# Patient Record
Sex: Female | Born: 1960
Health system: Southern US, Community
[De-identification: ages and names within clinical notes are randomized; demographics above are authoritative.]

## PROBLEM LIST (undated history)

## (undated) DIAGNOSIS — Z8489 Family history of other specified conditions: Secondary | ICD-10-CM

## (undated) DIAGNOSIS — R112 Nausea with vomiting, unspecified: Secondary | ICD-10-CM

## (undated) DIAGNOSIS — Z789 Other specified health status: Secondary | ICD-10-CM

## (undated) DIAGNOSIS — Z9889 Other specified postprocedural states: Secondary | ICD-10-CM

## (undated) HISTORY — PX: TUBAL LIGATION: SHX77

---

## 2004-01-30 ENCOUNTER — Other Ambulatory Visit: Admission: RE | Admit: 2004-01-30 | Discharge: 2004-01-30 | Payer: Self-pay | Admitting: Obstetrics and Gynecology

## 2005-09-16 ENCOUNTER — Other Ambulatory Visit: Admission: RE | Admit: 2005-09-16 | Discharge: 2005-09-16 | Payer: Self-pay | Admitting: Obstetrics and Gynecology

## 2005-11-24 HISTORY — PX: BREAST ENHANCEMENT SURGERY: SHX7

## 2011-03-14 ENCOUNTER — Inpatient Hospital Stay (INDEPENDENT_AMBULATORY_CARE_PROVIDER_SITE_OTHER)
Admission: RE | Admit: 2011-03-14 | Discharge: 2011-03-14 | Disposition: A | Discharge status: Home / Self Care | Payer: 59 | Source: Ambulatory Visit | Attending: Family Medicine | Admitting: Family Medicine

## 2011-03-14 DIAGNOSIS — J309 Allergic rhinitis, unspecified: Secondary | ICD-10-CM

## 2011-03-14 DIAGNOSIS — L259 Unspecified contact dermatitis, unspecified cause: Secondary | ICD-10-CM

## 2012-11-20 ENCOUNTER — Emergency Department
Admission: EM | Admit: 2012-11-20 | Discharge: 2012-11-20 | Disposition: A | Discharge status: Home / Self Care | Payer: 59 | Source: Home / Self Care | Attending: Family Medicine | Admitting: Family Medicine

## 2012-11-20 ENCOUNTER — Encounter: Payer: Self-pay | Admitting: *Deleted

## 2012-11-20 DIAGNOSIS — J069 Acute upper respiratory infection, unspecified: Secondary | ICD-10-CM

## 2012-11-20 DIAGNOSIS — J029 Acute pharyngitis, unspecified: Secondary | ICD-10-CM

## 2012-11-20 LAB — POCT RAPID STREP A (OFFICE): Rapid Strep A Screen: NEGATIVE

## 2012-11-20 MED ORDER — AZITHROMYCIN 250 MG PO TABS: ORAL_TABLET | ORAL | Status: DC

## 2012-11-20 MED ORDER — BENZONATATE 200 MG PO CAPS: 200.0000 mg | ORAL_CAPSULE | Freq: Every day | ORAL | Status: DC

## 2012-11-20 NOTE — ED Notes (Signed)
Patient c/o sore throat, cough, wheeze, hoarseness x 3 days. Has tried OTC Robitussin and Cepacal has not helped.

## 2012-11-20 NOTE — ED Provider Notes (Signed)
History     CSN: [id number]  Arrival date & time 11/20/12  1029   First MD Initiated Contact with Patient 11/20/12 1154      Chief Complaint  Patient presents with  . Sore Throat      HPI Comments: Patient complains of sore throat, cough, wheeze, hoarseness for 3 days. She has tried OTC Robitussin and Cepacal without relief.  The history is provided by the patient.    History reviewed. No pertinent past medical history.  Past Surgical History  Procedure Date  . Cesarean section     No pertinent family history .  History  Substance Use Topics  . Smoking status: Never Smoker   . Smokeless tobacco: Not on file  . Alcohol Use: Yes    OB History    Grav Para Term Preterm Abortions TAB SAB Ect Mult Living                  Review of Systems + sore throat + cough No pleuritic pain + wheezing + nasal congestion + post-nasal drainage No sinus pain/pressure No itchy/red eyes No earache No hemoptysis No SOB No fever/chills + nausea No vomiting No abdominal pain No diarrhea No urinary symptoms No skin rashes + fatigue No myalgias No headache Used OTC meds without relief  Allergies  Review of patient's allergies indicates no known allergies.  Home Medications   Current Outpatient Rx  Name  Route  Sig  Dispense  Refill  . AZITHROMYCIN 250 MG PO TABS      Take 2 tabs today; then begin one tab once daily for 4 more days. (Rx void after 11/28/12)   6 each   0   . BENZONATATE 200 MG PO CAPS   Oral   Take 1 capsule (200 mg total) by mouth at bedtime. Take as needed for cough   12 capsule   0     BP 114/74  Pulse 64  Temp 98.3 F (36.8 C) (Oral)  Resp 16  Ht 5\' 4"  (1.626 m)  Wt 115 lb 8 oz (52.39 kg)  BMI 19.83 kg/m2  SpO2 99%  Physical Exam Nursing notes and Vital Signs reviewed. Appearance:  Patient appears healthy, stated age, and in no acute distress Eyes:  Pupils are equal, round, and reactive to light and accomodation.  Extraocular  movement is intact.  Conjunctivae are not inflamed  Ears:  Canals normal.  Tympanic membranes normal.  Nose:  Mildly congested turbinates.  No sinus tenderness.   Pharynx:  Normal Neck:  Supple.  Slightly tender shotty posterior nodes are palpated bilaterally  Lungs:  Clear to auscultation.  Breath sounds are equal.  Heart:  Regular rate and rhythm without murmurs, rubs, or gallops.  Abdomen:  Nontender without masses or hepatosplenomegaly.  Bowel sounds are present.  No CVA or flank tenderness.  Extremities:  No edema.  No calf tenderness Skin:  No rash present.   ED Course  Procedures  none   Labs Reviewed  POCT RAPID STREP A (OFFICE) Negative      1. Acute pharyngitis   2. Acute upper respiratory infections of unspecified site; suspect viral URI       MDM  There is no evidence of bacterial infection today.   Treat symptomatically for now  Prescription written for Benzonatate (Tessalon) to take at bedtime for night-time cough.  Take Mucinex D (guaifenesin with decongestant) twice daily for congestion.  Increase fluid intake, rest. May use Afrin nasal spray (or generic oxymetazoline)  twice daily for about 5 days.  Also recommend using saline nasal spray several times daily and saline nasal irrigation (AYR is a common brand) Stop all antihistamines for now, and other non-prescription cough/cold preparations. May take Ibuprofen 200mg , 4 tabs every 8 hours with food for sore throat Begin Azithromycin if not improving about 5 days or if persistent fever develops (Given a prescription to hold, with an expiration date)  Follow-up with family doctor if not improving 7 to 10 days.         Lattie Haw, MD 11/23/12 [phone]

## 2013-03-19 ENCOUNTER — Emergency Department
Admission: EM | Admit: 2013-03-19 | Discharge: 2013-03-19 | Disposition: A | Discharge status: Home / Self Care | Payer: 59 | Source: Home / Self Care | Attending: Family Medicine | Admitting: Family Medicine

## 2013-03-19 ENCOUNTER — Emergency Department (HOSPITAL_BASED_OUTPATIENT_CLINIC_OR_DEPARTMENT_OTHER): Admission: EM | Admit: 2013-03-19 | Discharge: 2013-03-19 | Discharge status: Against Medical Advice | Payer: 59

## 2013-03-19 ENCOUNTER — Encounter: Payer: Self-pay | Admitting: Emergency Medicine

## 2013-03-19 DIAGNOSIS — R21 Rash and other nonspecific skin eruption: Secondary | ICD-10-CM

## 2013-03-19 MED ORDER — TRIAMCINOLONE ACETONIDE 40 MG/ML IJ SUSP: 40.0000 mg | Freq: Once | INTRAMUSCULAR | Status: DC

## 2013-03-19 MED ORDER — TRIAMCINOLONE ACETONIDE 40 MG/ML IJ SUSP
40.0000 mg | Freq: Once | INTRAMUSCULAR | Status: AC
  Administered 2013-03-19: 40 mg via INTRAMUSCULAR

## 2013-03-19 NOTE — ED Notes (Signed)
Patient relates rash and mouth mucus membrane sores for past week with evaluations [facility]. Uncomfortable and teary.

## 2013-03-19 NOTE — ED Provider Notes (Addendum)
History     CSN: [id number]  Arrival date & time 03/19/13  1712   First MD Initiated Contact with Patient 03/19/13 1732      Chief Complaint  Patient presents with  . Rash       HPI Comments: Patient reports that one month (March 26) ago she developed swelling under her tongue which was followed by other painful sores and ulcerations in her mouth.  The lesions were often covered with a plaque that could be easily scraped off.  The lesions persisted and she visited [facility] where evaluation included the following (all normal or negative):  CBC/diff, Vitamin B12 and folate, ANA, Rheumatoid arthritis factor, ESR, and HIV.  Three days ago she had a biopsy of one of her mouth lesions.  One week ago she visited a [facility] where she was empirically treated for Herpes Labialis with famciclovir, without significant improvement. Three days ago she developed a mildly pruritic rash on her face (upper lip), torso (mostly upper chest), arms/hands (including palms).  She feels well otherwise.  No fevers, chills, and sweats.  Patient is a 52 y.o. female presenting with rash. The history is provided by the patient.  Rash Location:  Torso, hand and face Facial rash location:  Lip Hand rash location:  L palm, R palm, dorsum of L hand and dorsum of R hand Torso rash location: upper chest. Quality: itchiness and redness   Quality: not draining, not swelling and not weeping   Severity:  Mild Onset quality:  Sudden Duration:  3 days Timing:  Constant Progression:  Spreading Chronicity:  New Relieved by:  Nothing Ineffective treatments:  Antihistamines Associated symptoms: fatigue   Associated symptoms: no abdominal pain, no diarrhea, no fever, no headaches, no joint pain, no myalgias, no nausea, no sore throat, no throat swelling, no tongue swelling and no URI     History reviewed. No pertinent past medical history.  Past Surgical History  Procedure Laterality Date  . Cesarean section        History reviewed. No pertinent family history.  History  Substance Use Topics  . Smoking status: Never Smoker   . Smokeless tobacco: Not on file  . Alcohol Use: Yes    OB History   Grav Para Term Preterm Abortions TAB SAB Ect Mult Living                  Review of Systems  Constitutional: Positive for fatigue. Negative for fever.  HENT: Negative for sore throat.   Gastrointestinal: Negative for nausea, abdominal pain and diarrhea.  Musculoskeletal: Negative for myalgias and arthralgias.  Skin: Positive for rash.  Neurological: Negative for headaches.  All other systems reviewed and are negative.    Allergies  Review of patient's allergies indicates no known allergies.  Home Medications   Current Outpatient Rx  Name  Route  Sig  Dispense  Refill  . azithromycin (ZITHROMAX Z-PAK) 250 MG tablet      Take 2 tabs today; then begin one tab once daily for 4 more days. (Rx void after 11/28/12)   6 each   0   . benzonatate (TESSALON) 200 MG capsule   Oral   Take 1 capsule (200 mg total) by mouth at bedtime. Take as needed for cough   12 capsule   0     BP 136/79  Pulse 65  Temp(Src) 97.9 F (36.6 C) (Oral)  Resp 16  Ht 5\' 4"  (1.626 m)  Wt 110 lb (49.896 kg)  BMI 18.87 kg/m2  SpO2 98%  LMP 03/09/2013  Physical Exam  Nursing note and vitals reviewed. Constitutional: She is oriented to person, place, and time. She appears well-developed and well-nourished. No distress.  HENT:  Head: Normocephalic.  Right Ear: External ear normal.  Left Ear: External ear normal.  Nose: Nose normal.  Mouth/Throat: Oral lesions present. No oropharyngeal exudate, posterior oropharyngeal edema or posterior oropharyngeal erythema.    There is a macular erythematous eruption of upper lip as noted on diagram  Mouth reveals several small aphthous appearing lesions on buccal mucosae.  Right tympanic membrane is deformed and scarred  Eyes: Conjunctivae and EOM are normal. Pupils  are equal, round, and reactive to light.  Neck: Neck supple.  Mildly tender shotty sub-mental nodes palpated  Cardiovascular: Normal rate, regular rhythm and normal heart sounds.   Pulmonary/Chest: Breath sounds normal.  Abdominal: There is no tenderness.  Musculoskeletal: She exhibits no edema and no tenderness.  Lymphadenopathy:    She has cervical adenopathy.  Neurological: She is alert and oriented to person, place, and time.  Skin: Skin is warm and dry. Rash noted. Rash is macular.     Skin on trunk and extremities has a non-specific erythematous macular eruption.  Note small barely visible 1mm lesions on palms.    ED Course  Procedures  none      1. Rash and nonspecific skin eruption; differential could include secondary syphilis and lichen planus.       MDM  Reviewed all outside lab results brought by patient from her previous evaluations. Patient needs to followup with her ENT to determine results of recent mouth lesion biopsy.  Now that she has developed a rash, recommend that she arrange appointment with a dermatologist for further evaluation. Suggested that we obtain an RPR today, and repeat CBC and sed rate (however, lab courier has already departed for the day and I suggested that she return in the morning for blood tests)  She states that she has an appt with her GYN in two days and will have blood tests done then. At patient's request, given Kenalog 40mg  IM for symptomatic relief.        Lattie Haw, MD 03/21/13 1120  Lattie Haw, MD 03/21/13 [phone]

## 2013-03-21 ENCOUNTER — Telehealth: Payer: Self-pay | Admitting: *Deleted

## 2013-04-13 ENCOUNTER — Ambulatory Visit: Payer: 59 | Admitting: Infectious Disease

## 2015-12-26 DIAGNOSIS — M25512 Pain in left shoulder: Secondary | ICD-10-CM | POA: Diagnosis not present

## 2015-12-26 DIAGNOSIS — M542 Cervicalgia: Secondary | ICD-10-CM | POA: Diagnosis not present

## 2015-12-26 MED FILL — predniSONE 10 MG (21) TBPK: 10 | 6 days supply | Qty: 21 | Fill #0

## 2015-12-26 MED FILL — METHOCARBAMOL 500 MG TABLET: 500 | 15 days supply | Qty: 60 | Fill #0

## 2015-12-26 MED FILL — METHOCARBAMOL 500 MG TABLET: 500 | 15 days supply | Qty: 60 | Fill #0 | Status: TO

## 2016-01-01 ENCOUNTER — Other Ambulatory Visit (HOSPITAL_COMMUNITY): Payer: Self-pay | Admitting: Orthopedic Surgery

## 2016-01-01 DIAGNOSIS — M542 Cervicalgia: Secondary | ICD-10-CM

## 2016-01-05 ENCOUNTER — Ambulatory Visit (HOSPITAL_BASED_OUTPATIENT_CLINIC_OR_DEPARTMENT_OTHER)
Admission: RE | Admit: 2016-01-05 | Discharge: 2016-01-05 | Disposition: A | Discharge status: Home / Self Care | Payer: 59 | Source: Ambulatory Visit | Attending: Orthopedic Surgery | Admitting: Orthopedic Surgery

## 2016-01-05 DIAGNOSIS — R200 Anesthesia of skin: Secondary | ICD-10-CM | POA: Insufficient documentation

## 2016-01-05 DIAGNOSIS — M4802 Spinal stenosis, cervical region: Secondary | ICD-10-CM | POA: Diagnosis not present

## 2016-01-05 DIAGNOSIS — R531 Weakness: Secondary | ICD-10-CM | POA: Diagnosis not present

## 2016-01-05 DIAGNOSIS — M47892 Other spondylosis, cervical region: Secondary | ICD-10-CM | POA: Insufficient documentation

## 2016-01-05 DIAGNOSIS — M2578 Osteophyte, vertebrae: Secondary | ICD-10-CM | POA: Diagnosis not present

## 2016-01-05 DIAGNOSIS — M542 Cervicalgia: Secondary | ICD-10-CM | POA: Insufficient documentation

## 2016-01-05 DIAGNOSIS — M50222 Other cervical disc displacement at C5-C6 level: Secondary | ICD-10-CM | POA: Diagnosis not present

## 2016-01-09 ENCOUNTER — Other Ambulatory Visit: Payer: Self-pay | Admitting: Neurosurgery

## 2016-01-09 DIAGNOSIS — M5412 Radiculopathy, cervical region: Secondary | ICD-10-CM | POA: Diagnosis not present

## 2016-01-09 DIAGNOSIS — M50223 Other cervical disc displacement at C6-C7 level: Secondary | ICD-10-CM | POA: Diagnosis not present

## 2016-01-09 DIAGNOSIS — M47812 Spondylosis without myelopathy or radiculopathy, cervical region: Secondary | ICD-10-CM | POA: Diagnosis not present

## 2016-01-09 DIAGNOSIS — M542 Cervicalgia: Secondary | ICD-10-CM | POA: Diagnosis not present

## 2016-01-09 MED FILL — HYDROCODON-APAP 5-325: 5-325 | 15 days supply | Qty: 60 | Fill #0

## 2016-01-15 MED FILL — OXYCODONE/APAP 5-325: 5-325 | 5 days supply | Qty: 60 | Fill #0

## 2016-01-15 MED FILL — METHYLPREDNISOLONE 4 MG TAB: 4 | 6 days supply | Qty: 21 | Fill #0

## 2016-01-16 ENCOUNTER — Encounter (HOSPITAL_COMMUNITY): Payer: Self-pay

## 2016-01-16 ENCOUNTER — Other Ambulatory Visit (HOSPITAL_COMMUNITY): Payer: Self-pay | Admitting: *Deleted

## 2016-01-16 ENCOUNTER — Encounter (HOSPITAL_COMMUNITY)
Admission: RE | Admit: 2016-01-16 | Discharge: 2016-01-16 | Disposition: A | Discharge status: Home / Self Care | Payer: 59 | Source: Ambulatory Visit | Attending: Neurosurgery | Admitting: Neurosurgery

## 2016-01-16 DIAGNOSIS — M4722 Other spondylosis with radiculopathy, cervical region: Secondary | ICD-10-CM | POA: Diagnosis not present

## 2016-01-16 DIAGNOSIS — M50123 Cervical disc disorder at C6-C7 level with radiculopathy: Secondary | ICD-10-CM | POA: Diagnosis not present

## 2016-01-16 HISTORY — DX: Nausea with vomiting, unspecified: R11.2

## 2016-01-16 HISTORY — DX: Other specified health status: Z78.9

## 2016-01-16 HISTORY — DX: Family history of other specified conditions: Z84.89

## 2016-01-16 HISTORY — DX: Other specified postprocedural states: Z98.890

## 2016-01-16 LAB — CBC
HEMATOCRIT: 38.5 % (ref 36.0–46.0)
HEMOGLOBIN: 13.1 g/dL (ref 12.0–15.0)
MCH: 32 pg (ref 26.0–34.0)
MCHC: 34 g/dL (ref 30.0–36.0)
MCV: 93.9 fL (ref 78.0–100.0)
Platelets: 231 10*3/uL (ref 150–400)
RBC: 4.1 MIL/uL (ref 3.87–5.11)
RDW: 13.2 % (ref 11.5–15.5)
WBC: 10.1 10*3/uL (ref 4.0–10.5)

## 2016-01-16 LAB — HCG, SERUM, QUALITATIVE: Preg, Serum: NEGATIVE

## 2016-01-16 LAB — SURGICAL PCR SCREEN
MRSA, PCR: NEGATIVE
STAPHYLOCOCCUS AUREUS: NEGATIVE

## 2016-01-16 NOTE — Progress Notes (Signed)
Pt denies cardiac history, chest pain or sob. Pt in severe pain during PAT appt. Unable to sit still, states pain radiating down left arm circumferentially. Pt took 1 Oxycodone during appt. Pt's son will be driving her home.

## 2016-01-16 NOTE — Pre-Procedure Instructions (Signed)
Jacqueline Holder  01/16/2016      Your procedure is scheduled on Friday, January 18, 2016 at 12:00 Noon.   Report to [facility] Entrance "A" Admitting Office at 9:00 AM.   Call this number if you have problems the morning of surgery: [phone]   Any questions prior to day of surgery, please call [phone] between 8 & 4 PM.   Remember:  Do not eat food or drink liquids after midnight Thursday, 01/17/16.  Take these medicines the morning of surgery with A SIP OF WATER: Hydrocodone - if needed  Stop Multivitamins as of today. Do not use Aspirin products or NSAIDS (Ibuprofen, Aleve, etc.) prior to surgery.   Do not wear jewelry, make-up or nail polish.  Do not wear lotions, powders, or perfumes.  You may wear deodorant.  Do not shave 48 hours prior to surgery.    Do not bring valuables to the hospital.  [facility] is not responsible for any belongings or valuables.  Contacts, dentures or bridgework may not be worn into surgery.  Leave your suitcase in the car.  After surgery it may be brought to your room.  For patients admitted to the hospital, discharge time will be determined by your treatment team.  Special instructions:  [facility] - Preparing for Surgery  Before surgery, you can play an important role.  Because skin is not sterile, your skin needs to be as free of germs as possible.  You can reduce the number of germs on you skin by washing with CHG (chlorahexidine gluconate) soap before surgery.  CHG is an antiseptic cleaner which kills germs and bonds with the skin to continue killing germs even after washing.  Please DO NOT use if you have an allergy to CHG or antibacterial soaps.  If your skin becomes reddened/irritated stop using the CHG and inform your nurse when you arrive at Short Stay.  Do not shave (including legs and underarms) for at least 48 hours prior to the first CHG shower.  You may shave your face.  Please follow these instructions  carefully:   1.  Shower with CHG Soap the night before surgery and the                                morning of Surgery.  2.  If you choose to wash your hair, wash your hair first as usual with your       normal shampoo.  3.  After you shampoo, rinse your hair and body thoroughly to remove the                      Shampoo.  4.  Use CHG as you would any other liquid soap.  You can apply chg directly       to the skin and wash gently with scrungie or a clean washcloth.  5.  Apply the CHG Soap to your body ONLY FROM THE NECK DOWN.        Do not use on open wounds or open sores.  Avoid contact with your eyes, ears, mouth and genitals (private parts).  Wash genitals (private parts) with your normal soap.  6.  Wash thoroughly, paying special attention to the area where your surgery        will be performed.  7.  Thoroughly rinse your body with warm water from the neck down.  8.  DO NOT shower/wash with your normal soap after using and rinsing off       the CHG Soap.  9.  Pat yourself dry with a clean towel.            10.  Wear clean pajamas.            11.  Place clean sheets on your bed the night of your first shower and do not        sleep with pets.  Day of Surgery  Do not apply any lotions the morning of surgery.  Please wear clean clothes to the hospital.   Please read over the following fact sheets that you were given. Pain Booklet, Coughing and Deep Breathing, MRSA Information and Surgical Site Infection Prevention

## 2016-01-17 MED ORDER — CEFAZOLIN SODIUM-DEXTROSE 2-3 GM-% IV SOLR
2.0000 g | INTRAVENOUS | Status: AC
  Administered 2016-01-18: 2 g via INTRAVENOUS
  Filled 2016-01-17: qty 50

## 2016-01-18 ENCOUNTER — Encounter (HOSPITAL_COMMUNITY)
Admission: RE | Disposition: A | Discharge status: Home / Self Care | Payer: Self-pay | Source: Ambulatory Visit | Attending: Neurosurgery

## 2016-01-18 ENCOUNTER — Ambulatory Visit (HOSPITAL_COMMUNITY): Payer: 59 | Admitting: Anesthesiology

## 2016-01-18 ENCOUNTER — Ambulatory Visit (HOSPITAL_COMMUNITY): Payer: 59

## 2016-01-18 ENCOUNTER — Encounter (HOSPITAL_COMMUNITY): Payer: Self-pay

## 2016-01-18 ENCOUNTER — Observation Stay (HOSPITAL_COMMUNITY)
Admission: RE | Admit: 2016-01-18 | Discharge: 2016-01-18 | Disposition: A | Discharge status: Home / Self Care | Payer: 59 | Source: Ambulatory Visit | Attending: Neurosurgery | Admitting: Neurosurgery

## 2016-01-18 DIAGNOSIS — M4722 Other spondylosis with radiculopathy, cervical region: Secondary | ICD-10-CM | POA: Diagnosis not present

## 2016-01-18 DIAGNOSIS — M5020 Other cervical disc displacement, unspecified cervical region: Secondary | ICD-10-CM | POA: Diagnosis not present

## 2016-01-18 DIAGNOSIS — M50123 Cervical disc disorder at C6-C7 level with radiculopathy: Principal | ICD-10-CM | POA: Insufficient documentation

## 2016-01-18 DIAGNOSIS — Z9889 Other specified postprocedural states: Secondary | ICD-10-CM | POA: Diagnosis not present

## 2016-01-18 DIAGNOSIS — M4712 Other spondylosis with myelopathy, cervical region: Secondary | ICD-10-CM | POA: Diagnosis not present

## 2016-01-18 DIAGNOSIS — Z419 Encounter for procedure for purposes other than remedying health state, unspecified: Secondary | ICD-10-CM

## 2016-01-18 HISTORY — PX: POSTERIOR CERVICAL LAMINECTOMY WITH MET- RX: SHX6035

## 2016-01-18 SURGERY — POSTERIOR CERVICAL LAMINECTOMY WITH MET- RX
Anesthesia: General | Site: Back | Laterality: Left

## 2016-01-18 MED ORDER — METHOCARBAMOL 1000 MG/10ML IJ SOLN: 500.0000 mg | Freq: Four times a day (QID) | INTRAVENOUS | Status: DC | PRN

## 2016-01-18 MED ORDER — KCL IN DEXTROSE-NACL 20-5-0.45 MEQ/L-%-% IV SOLN: INTRAVENOUS | Status: DC

## 2016-01-18 MED ORDER — VITAMIN B-12 1000 MCG PO TABS: 1000.0000 ug | ORAL_TABLET | Freq: Every day | ORAL | Status: DC

## 2016-01-18 MED ORDER — POLYETHYLENE GLYCOL 3350 17 G PO PACK: 17.0000 g | PACK | Freq: Every day | ORAL | Status: DC | PRN

## 2016-01-18 MED ORDER — FENTANYL CITRATE (PF) 100 MCG/2ML IJ SOLN
INTRAMUSCULAR | Status: DC | PRN
  Administered 2016-01-18 (×4): 50 ug via INTRAVENOUS

## 2016-01-18 MED ORDER — FENTANYL CITRATE (PF) 250 MCG/5ML IJ SOLN
INTRAMUSCULAR | Status: AC
  Filled 2016-01-18: qty 5

## 2016-01-18 MED ORDER — METHYLPREDNISOLONE ACETATE 80 MG/ML IJ SUSP
INTRAMUSCULAR | Status: DC | PRN
  Administered 2016-01-18: 80 mg

## 2016-01-18 MED ORDER — CALCIUM CARBONATE 1250 (500 CA) MG PO TABS: 1.0000 | ORAL_TABLET | Freq: Every day | ORAL | Status: DC

## 2016-01-18 MED ORDER — PANTOPRAZOLE SODIUM 40 MG IV SOLR: 40.0000 mg | Freq: Every day | INTRAVENOUS | Status: DC

## 2016-01-18 MED ORDER — HYDROMORPHONE HCL 1 MG/ML IJ SOLN
0.5000 mg | INTRAMUSCULAR | Status: DC | PRN
  Administered 2016-01-18: 1 mg via INTRAVENOUS
  Filled 2016-01-18: qty 1

## 2016-01-18 MED ORDER — SUGAMMADEX SODIUM 200 MG/2ML IV SOLN
INTRAVENOUS | Status: AC
  Filled 2016-01-18: qty 2

## 2016-01-18 MED ORDER — OXYCODONE-ACETAMINOPHEN 5-325 MG PO TABS: 1.0000 | ORAL_TABLET | ORAL | Status: DC | PRN

## 2016-01-18 MED ORDER — HEMOSTATIC AGENTS (NO CHARGE) OPTIME
TOPICAL | Status: DC | PRN
  Administered 2016-01-18: 1 via TOPICAL

## 2016-01-18 MED ORDER — ACETAMINOPHEN 325 MG PO TABS: 650.0000 mg | ORAL_TABLET | ORAL | Status: DC | PRN

## 2016-01-18 MED ORDER — OXYCODONE HCL 5 MG PO TABS: 5.0000 mg | ORAL_TABLET | Freq: Once | ORAL | Status: DC | PRN

## 2016-01-18 MED ORDER — ONDANSETRON HCL 4 MG/2ML IJ SOLN
INTRAMUSCULAR | Status: DC | PRN
  Administered 2016-01-18: 4 mg via INTRAVENOUS

## 2016-01-18 MED ORDER — PHENYLEPHRINE HCL 10 MG/ML IJ SOLN
10.0000 mg | INTRAVENOUS | Status: DC | PRN
  Administered 2016-01-18: 10 ug/min via INTRAVENOUS

## 2016-01-18 MED ORDER — PROPOFOL 10 MG/ML IV BOLUS
INTRAVENOUS | Status: AC
  Filled 2016-01-18: qty 20

## 2016-01-18 MED ORDER — ACETAMINOPHEN 650 MG RE SUPP: 650.0000 mg | RECTAL | Status: DC | PRN

## 2016-01-18 MED ORDER — LIDOCAINE-EPINEPHRINE 1 %-1:100000 IJ SOLN
INTRAMUSCULAR | Status: DC | PRN
Start: 2016-01-18 — End: 2016-01-18
  Administered 2016-01-18: 5 mL

## 2016-01-18 MED ORDER — SODIUM CHLORIDE 0.9% FLUSH: 3.0000 mL | INTRAVENOUS | Status: DC | PRN

## 2016-01-18 MED ORDER — LACTATED RINGERS IV SOLN
INTRAVENOUS | Status: DC
  Administered 2016-01-18: 10:00:00 via INTRAVENOUS

## 2016-01-18 MED ORDER — ROCURONIUM BROMIDE 100 MG/10ML IV SOLN
INTRAVENOUS | Status: DC | PRN
  Administered 2016-01-18: 10 mg via INTRAVENOUS
  Administered 2016-01-18: 40 mg via INTRAVENOUS
  Administered 2016-01-18: 20 mg via INTRAVENOUS
  Administered 2016-01-18 (×3): 10 mg via INTRAVENOUS

## 2016-01-18 MED ORDER — ARTIFICIAL TEARS OP OINT
TOPICAL_OINTMENT | OPHTHALMIC | Status: AC
  Filled 2016-01-18: qty 3.5

## 2016-01-18 MED ORDER — ONDANSETRON HCL 4 MG/2ML IJ SOLN: 4.0000 mg | INTRAMUSCULAR | Status: DC | PRN

## 2016-01-18 MED ORDER — PROPOFOL 10 MG/ML IV BOLUS
INTRAVENOUS | Status: DC | PRN
  Administered 2016-01-18: 150 mg via INTRAVENOUS

## 2016-01-18 MED ORDER — ROCURONIUM BROMIDE 50 MG/5ML IV SOLN
INTRAVENOUS | Status: AC
  Filled 2016-01-18: qty 1

## 2016-01-18 MED ORDER — THROMBIN 5000 UNITS EX SOLR
CUTANEOUS | Status: DC | PRN
  Administered 2016-01-18 (×2): 5000 [IU] via TOPICAL

## 2016-01-18 MED ORDER — DIAZEPAM 5 MG PO TABS: 5.0000 mg | ORAL_TABLET | Freq: Four times a day (QID) | ORAL | Status: DC | PRN

## 2016-01-18 MED ORDER — HYDROCODONE-ACETAMINOPHEN 5-325 MG PO TABS: 1.0000 | ORAL_TABLET | ORAL | Status: DC | PRN

## 2016-01-18 MED ORDER — BUPIVACAINE HCL (PF) 0.5 % IJ SOLN
INTRAMUSCULAR | Status: DC | PRN
  Administered 2016-01-18: 5 mL

## 2016-01-18 MED ORDER — ONDANSETRON HCL 4 MG/2ML IJ SOLN
4.0000 mg | Freq: Once | INTRAMUSCULAR | Status: DC | PRN
Start: 2016-01-18 — End: 2016-01-18

## 2016-01-18 MED ORDER — SODIUM CHLORIDE 0.9 % IV SOLN
INTRAVENOUS | Status: DC | PRN
  Administered 2016-01-18: 11:00:00 via INTRAVENOUS

## 2016-01-18 MED ORDER — EPHEDRINE SULFATE 50 MG/ML IJ SOLN
INTRAMUSCULAR | Status: DC | PRN
  Administered 2016-01-18 (×2): 5 mg via INTRAVENOUS

## 2016-01-18 MED ORDER — ZOLPIDEM TARTRATE 5 MG PO TABS: 5.0000 mg | ORAL_TABLET | Freq: Every evening | ORAL | Status: DC | PRN

## 2016-01-18 MED ORDER — LACTATED RINGERS IV SOLN
INTRAVENOUS | Status: DC | PRN
  Administered 2016-01-18: 11:00:00 via INTRAVENOUS

## 2016-01-18 MED ORDER — SUCCINYLCHOLINE CHLORIDE 20 MG/ML IJ SOLN
INTRAMUSCULAR | Status: AC
  Filled 2016-01-18: qty 1

## 2016-01-18 MED ORDER — SUGAMMADEX SODIUM 200 MG/2ML IV SOLN
INTRAVENOUS | Status: DC | PRN
Start: 2016-01-18 — End: 2016-01-18
  Administered 2016-01-18: 100 mg via INTRAVENOUS

## 2016-01-18 MED ORDER — ONDANSETRON HCL 4 MG/2ML IJ SOLN
INTRAMUSCULAR | Status: AC
  Filled 2016-01-18: qty 2

## 2016-01-18 MED ORDER — OXYCODONE HCL 5 MG/5ML PO SOLN: 5.0000 mg | Freq: Once | ORAL | Status: DC | PRN

## 2016-01-18 MED ORDER — ALUM & MAG HYDROXIDE-SIMETH 200-200-20 MG/5ML PO SUSP: 30.0000 mL | Freq: Four times a day (QID) | ORAL | Status: DC | PRN

## 2016-01-18 MED ORDER — SODIUM CHLORIDE 0.9 % IV SOLN: 250.0000 mL | INTRAVENOUS | Status: DC

## 2016-01-18 MED ORDER — METHOCARBAMOL 500 MG PO TABS
ORAL_TABLET | ORAL | Status: AC
  Filled 2016-01-18: qty 1

## 2016-01-18 MED ORDER — 0.9 % SODIUM CHLORIDE (POUR BTL) OPTIME
TOPICAL | Status: DC | PRN
Start: 2016-01-18 — End: 2016-01-18
  Administered 2016-01-18: 1000 mL

## 2016-01-18 MED ORDER — LIDOCAINE HCL 4 % EX SOLN
CUTANEOUS | Status: DC | PRN
  Administered 2016-01-18: 3 mL via TOPICAL

## 2016-01-18 MED ORDER — LIDOCAINE HCL (CARDIAC) 20 MG/ML IV SOLN
INTRAVENOUS | Status: AC
  Filled 2016-01-18: qty 10

## 2016-01-18 MED ORDER — FENTANYL CITRATE (PF) 100 MCG/2ML IJ SOLN
25.0000 ug | INTRAMUSCULAR | Status: DC | PRN
  Administered 2016-01-18 (×2): 50 ug via INTRAVENOUS

## 2016-01-18 MED ORDER — MIDAZOLAM HCL 5 MG/5ML IJ SOLN
INTRAMUSCULAR | Status: DC | PRN
  Administered 2016-01-18: 2 mg via INTRAVENOUS

## 2016-01-18 MED ORDER — DEXAMETHASONE SODIUM PHOSPHATE 10 MG/ML IJ SOLN
INTRAMUSCULAR | Status: DC | PRN
  Administered 2016-01-18: 10 mg via INTRAVENOUS

## 2016-01-18 MED ORDER — BACITRACIN ZINC 500 UNIT/GM EX OINT
TOPICAL_OINTMENT | CUTANEOUS | Status: DC | PRN
Start: 2016-01-18 — End: 2016-01-18
  Administered 2016-01-18: 1 via TOPICAL

## 2016-01-18 MED ORDER — MIDAZOLAM HCL 2 MG/2ML IJ SOLN
INTRAMUSCULAR | Status: AC
  Filled 2016-01-18: qty 2

## 2016-01-18 MED ORDER — FLEET ENEMA 7-19 GM/118ML RE ENEM: 1.0000 | ENEMA | Freq: Once | RECTAL | Status: DC | PRN

## 2016-01-18 MED ORDER — METHOCARBAMOL 500 MG PO TABS
500.0000 mg | ORAL_TABLET | Freq: Four times a day (QID) | ORAL | Status: DC | PRN
  Administered 2016-01-18: 500 mg via ORAL

## 2016-01-18 MED ORDER — DOCUSATE SODIUM 100 MG PO CAPS: 100.0000 mg | ORAL_CAPSULE | Freq: Two times a day (BID) | ORAL | Status: DC

## 2016-01-18 MED ORDER — CEFAZOLIN SODIUM 1-5 GM-% IV SOLN: 1.0000 g | Freq: Three times a day (TID) | INTRAVENOUS | Status: DC

## 2016-01-18 MED ORDER — LIDOCAINE HCL (CARDIAC) 20 MG/ML IV SOLN
INTRAVENOUS | Status: DC | PRN
  Administered 2016-01-18: 40 mg via INTRAVENOUS

## 2016-01-18 MED ORDER — BISACODYL 10 MG RE SUPP: 10.0000 mg | Freq: Every day | RECTAL | Status: DC | PRN

## 2016-01-18 MED ORDER — MENTHOL 3 MG MT LOZG
1.0000 | LOZENGE | OROMUCOSAL | Status: DC | PRN
Start: 2016-01-18 — End: 2016-01-18

## 2016-01-18 MED ORDER — SODIUM CHLORIDE 0.9% FLUSH: 3.0000 mL | Freq: Two times a day (BID) | INTRAVENOUS | Status: DC

## 2016-01-18 MED ORDER — ARTIFICIAL TEARS OP OINT
TOPICAL_OINTMENT | OPHTHALMIC | Status: DC | PRN
  Administered 2016-01-18: 1 via OPHTHALMIC

## 2016-01-18 MED ORDER — FENTANYL CITRATE (PF) 100 MCG/2ML IJ SOLN
INTRAMUSCULAR | Status: AC
  Filled 2016-01-18: qty 2

## 2016-01-18 MED ORDER — HYDROCODONE-ACETAMINOPHEN 5-325 MG PO TABS: 1.0000 | ORAL_TABLET | Freq: Four times a day (QID) | ORAL | Status: DC | PRN

## 2016-01-18 MED ORDER — DIAZEPAM 5 MG PO TABS: 5.0000 mg | ORAL_TABLET | Freq: Four times a day (QID) | ORAL | Status: AC | PRN

## 2016-01-18 MED ORDER — PHENOL 1.4 % MT LIQD
1.0000 | OROMUCOSAL | Status: DC | PRN
Start: 2016-01-18 — End: 2016-01-18

## 2016-01-18 MED ORDER — ADULT MULTIVITAMIN W/MINERALS CH: 1.0000 | ORAL_TABLET | Freq: Every day | ORAL | Status: DC

## 2016-01-18 SURGICAL SUPPLY — 57 items
BLADE CLIPPER SURG (BLADE) IMPLANT
BLADE SURG 15 STRL LF DISP TIS (BLADE) ×1 IMPLANT
BLADE SURG 15 STRL SS (BLADE) ×2
BUR 2.5 MTCH HD 16 (BUR) IMPLANT
BUR 2.5MM MTCH HD 16CM (BUR)
CANISTER SUCT 3000ML PPV (MISCELLANEOUS) ×3 IMPLANT
DECANTER SPIKE VIAL GLASS SM (MISCELLANEOUS) ×3 IMPLANT
DERMABOND ADVANCED (GAUZE/BANDAGES/DRESSINGS) ×2
DERMABOND ADVANCED .7 DNX12 (GAUZE/BANDAGES/DRESSINGS) ×1 IMPLANT
DRAPE C-ARM 42X72 X-RAY (DRAPES) IMPLANT
DRAPE LAPAROTOMY 100X72 PEDS (DRAPES) ×3 IMPLANT
DRAPE MICROSCOPE LEICA (MISCELLANEOUS) ×3 IMPLANT
DRAPE POUCH INSTRU U-SHP 10X18 (DRAPES) ×3 IMPLANT
DRSG OPSITE POSTOP 3X4 (GAUZE/BANDAGES/DRESSINGS) ×3 IMPLANT
DURAPREP 6ML APPLICATOR 50/CS (WOUND CARE) ×3 IMPLANT
ELECT BLADE 6.5 EXT (BLADE) ×3 IMPLANT
ELECT REM PT RETURN 9FT ADLT (ELECTROSURGICAL) ×3
ELECTRODE REM PT RTRN 9FT ADLT (ELECTROSURGICAL) ×1 IMPLANT
GAUZE SPONGE 4X4 16PLY XRAY LF (GAUZE/BANDAGES/DRESSINGS) IMPLANT
GLOVE BIO SURGEON STRL SZ8 (GLOVE) ×3 IMPLANT
GLOVE BIOGEL PI IND STRL 7.5 (GLOVE) ×2 IMPLANT
GLOVE BIOGEL PI IND STRL 8 (GLOVE) ×1 IMPLANT
GLOVE BIOGEL PI IND STRL 8.5 (GLOVE) ×1 IMPLANT
GLOVE BIOGEL PI INDICATOR 7.5 (GLOVE) ×4
GLOVE BIOGEL PI INDICATOR 8 (GLOVE) ×2
GLOVE BIOGEL PI INDICATOR 8.5 (GLOVE) ×2
GLOVE ECLIPSE 8.0 STRL XLNG CF (GLOVE) ×6 IMPLANT
GLOVE EXAM NITRILE LRG STRL (GLOVE) IMPLANT
GLOVE EXAM NITRILE MD LF STRL (GLOVE) IMPLANT
GLOVE EXAM NITRILE XL STR (GLOVE) IMPLANT
GLOVE EXAM NITRILE XS STR PU (GLOVE) IMPLANT
GLOVE SS BIOGEL STRL SZ 7 (GLOVE) ×1 IMPLANT
GLOVE SUPERSENSE BIOGEL SZ 7 (GLOVE) ×2
GLOVE SURG SS PI 7.0 STRL IVOR (GLOVE) ×6 IMPLANT
GOWN STRL REUS W/ TWL LRG LVL3 (GOWN DISPOSABLE) ×1 IMPLANT
GOWN STRL REUS W/ TWL XL LVL3 (GOWN DISPOSABLE) ×2 IMPLANT
GOWN STRL REUS W/TWL 2XL LVL3 (GOWN DISPOSABLE) ×3 IMPLANT
GOWN STRL REUS W/TWL LRG LVL3 (GOWN DISPOSABLE) ×2
GOWN STRL REUS W/TWL XL LVL3 (GOWN DISPOSABLE) ×4
KIT BASIN OR (CUSTOM PROCEDURE TRAY) ×3 IMPLANT
KIT ROOM TURNOVER OR (KITS) ×3 IMPLANT
NEEDLE HYPO 18GX1.5 BLUNT FILL (NEEDLE) ×3 IMPLANT
NEEDLE HYPO 22GX1.5 SAFETY (NEEDLE) IMPLANT
NEEDLE HYPO 25X1 1.5 SAFETY (NEEDLE) ×3 IMPLANT
NEEDLE SPNL 20GX3.5 QUINCKE YW (NEEDLE) ×3 IMPLANT
NS IRRIG 1000ML POUR BTL (IV SOLUTION) ×3 IMPLANT
PACK LAMINECTOMY NEURO (CUSTOM PROCEDURE TRAY) ×3 IMPLANT
PAD ARMBOARD 7.5X6 YLW CONV (MISCELLANEOUS) ×9 IMPLANT
PIN MAYFIELD SKULL DISP (PIN) ×3 IMPLANT
RUBBERBAND STERILE (MISCELLANEOUS) ×6 IMPLANT
SPONGE SURGIFOAM ABS GEL SZ50 (HEMOSTASIS) ×3 IMPLANT
SUT VIC AB 2-0 CT1 18 (SUTURE) ×3 IMPLANT
SUT VIC AB 3-0 SH 8-18 (SUTURE) ×3 IMPLANT
SYR 5ML LL (SYRINGE) ×3 IMPLANT
TOWEL OR 17X24 6PK STRL BLUE (TOWEL DISPOSABLE) ×3 IMPLANT
TOWEL OR 17X26 10 PK STRL BLUE (TOWEL DISPOSABLE) ×3 IMPLANT
WATER STERILE IRR 1000ML POUR (IV SOLUTION) ×3 IMPLANT

## 2016-01-18 NOTE — Discharge Summary (Signed)
Physician Discharge Summary  Patient ID: NARUMI FEWELL MRN: [id number] DOB/AGE: 1961/09/13 55 y.o.  Admit date: 01/18/2016 Discharge date: 01/18/2016  Admission Diagnoses:Herniated cervical disc C 67 left with radiculopathy  Discharge Diagnoses: Herniated cervical disc C 67 left with radiculopathy Active Problems:   Herniated cervical intervertebral disc   Discharged Condition: good  Hospital Course: Patient underwent microendoscopic MetRx sitting cervical microdiscectomy C 67 Left with good reduction in left arm pain and weakness.  Consults: None  Significant Diagnostic Studies: None  Treatments: surgery: microendoscopic MetRx sitting cervical microdiscectomy C 67 Left  Discharge Exam: Blood pressure 130/74, pulse 71, temperature 98.2 F (36.8 C), temperature source Oral, resp. rate 18, height 5\' 4"  (1.626 m), weight 52.164 kg (115 lb), SpO2 100 %. Neurologic: Alert and oriented X 3, normal strength and tone. Normal symmetric reflexes. Normal coordination and gait Wound:CDI  Disposition: Home     Medication List    STOP taking these medications        methylPREDNISolone 4 MG Tbpk tablet  Commonly known as:  MEDROL DOSEPAK      TAKE these medications        calcium carbonate 1250 (500 Ca) MG tablet  Commonly known as:  OS-CAL - dosed in mg of elemental calcium  Take 1 tablet by mouth daily with breakfast.     diazepam 5 MG tablet  Commonly known as:  VALIUM  Take 1 tablet (5 mg total) by mouth every 6 (six) hours as needed for muscle spasms.     HYDROcodone-acetaminophen 5-325 MG tablet  Commonly known as:  NORCO/VICODIN  Take 1 tablet by mouth every 6 (six) hours as needed for moderate pain.     multivitamin with minerals Tabs tablet  Take 1 tablet by mouth daily.     oxyCODONE-acetaminophen 5-325 MG tablet  Commonly known as:  PERCOCET/ROXICET  Take 1-2 tablets by mouth every 4 (four) hours as needed for moderate pain.     oxyCODONE-acetaminophen 5-325  MG tablet  Commonly known as:  PERCOCET/ROXICET  Take 1-2 tablets by mouth every 4 (four) hours as needed for severe pain.     vitamin B-12 1000 MCG tablet  Commonly known as:  CYANOCOBALAMIN  Take 1,000 mcg by mouth daily.         Signed: Peggyann Shoals, MD 01/18/2016, 4:54 PM

## 2016-01-18 NOTE — Anesthesia Procedure Notes (Addendum)
Procedure Name: Intubation Date/Time: 01/18/2016 11:08 AM Performed by: Suzy Bouchard Pre-anesthesia Checklist: Patient identified, Timeout performed, Emergency Drugs available, Suction available and Patient being monitored Patient Re-evaluated:Patient Re-evaluated prior to inductionOxygen Delivery Method: Circle system utilized Preoxygenation: Pre-oxygenation with 100% oxygen Intubation Type: IV induction Ventilation: Mask ventilation without difficulty Laryngoscope Size: Glidescope and 3 Grade View: Grade I Tube type: Oral Tube size: 7.0 mm Number of attempts: 1 Airway Equipment and Method: Video-laryngoscopy,  LTA kit utilized and Stylet Placement Confirmation: ETT inserted through vocal cords under direct vision,  breath sounds checked- equal and bilateral and positive ETCO2 Secured at: 22 cm Tube secured with: Tape Dental Injury: Teeth and Oropharynx as per pre-operative assessment  Comments: Neck remains neutral throughout induction and intubation.

## 2016-01-18 NOTE — Anesthesia Postprocedure Evaluation (Signed)
Anesthesia Post Note  Patient: Jacqueline Holder  Procedure(s) Performed: Procedure(s) (LRB): Left Cervical six-seven Sitting Posterior cervical microdiskectomy with Met-Rx (Left)  Patient location during evaluation: Nursing Unit Anesthesia Type: General Level of consciousness: awake, awake and alert and oriented Pain management: pain level controlled Vital Signs Assessment: post-procedure vital signs reviewed and stable Respiratory status: spontaneous breathing and nonlabored ventilation Anesthetic complications: no    Last Vitals:  Filed Vitals:   01/18/16 1445 01/18/16 1500  BP:  130/74  Pulse: 66 71  Temp: 36.5 C 36.8 C  Resp: 12 18    Last Pain:  Filed Vitals:   01/18/16 1841  PainSc: 3                  JOSLIN,DAVID COKER      

## 2016-01-18 NOTE — Progress Notes (Signed)
Awake, alert, conversant.  Arm pain better.  Strength full both upper extremities.  Doing well.

## 2016-01-18 NOTE — Transfer of Care (Signed)
Immediate Anesthesia Transfer of Care Note  Patient: Jacqueline Holder  Procedure(s) Performed: Procedure(s): Left Cervical six-seven Sitting Posterior cervical microdiskectomy with Met-Rx (Left)  Patient Location: PACU  Anesthesia Type:General  Level of Consciousness: awake and alert   Airway & Oxygen Therapy: Patient Spontanous Breathing and Patient connected to nasal cannula oxygen  Post-op Assessment: Report given to RN, Post -op Vital signs reviewed and stable and Patient moving all extremities  Post vital signs: Reviewed and stable  Last Vitals:  Filed Vitals:   01/18/16 0927  BP: 138/90  Pulse: 61  Temp: 36.5 C  Resp: 20    Complications: No apparent anesthesia complications

## 2016-01-18 NOTE — Discharge Summary (Signed)
Physician Discharge Summary  Patient ID: [name] MRN: [id number] DOB/AGE: 53-02-1961 53 y.o.  Admit date: 01/18/2016 Discharge date: 01/18/2016  Admission Diagnoses: Cervical herniated disc C 67 Left with spondylosis, disc degeneration, radiculopathy   Discharge Diagnoses: Cervical herniated disc C 67 Left with spondylosis, disc degeneration, radiculopathy s/p Left Cervical six-seven Sitting Posterior cervical microdiskectomy with Met-Rx (Left)  Active Problems:   Herniated cervical intervertebral disc   Discharged Condition: good  Hospital Course: Jacqueline Holder was admitted for surgery with dx HNP and radiculopathy. Following uncomplicated microdiscectomy, she recovered well and transferred to [facility] for observation.   Consults: None  Significant Diagnostic Studies: radiology: X-Ray: intra-op  Treatments: surgery: Left Cervical six-seven Sitting Posterior cervical microdiskectomy with Met-Rx (Left)   Discharge Exam: Blood pressure 130/74, pulse 71, temperature 98.2 F (36.8 C), temperature source Oral, resp. rate 18, height '5\' 4"'  (1.626 m), weight 52.164 kg (115 lb), SpO2 100 %. Alert, conversant, family at bedside. Mild left arm pain, mild shoulder soreness...both much improved from pre-op. Incision without erythema, swelling, or drainage. Dermabond beneath honeycomb. Soft collar in use only as needed for comfort. Good strength to confrontational testing BUE and hand intrinsics. Per DrStern, pt may go home tonight if she feels up to it, otherwise plan on d/c to home in am. She & husband verbalize understanding of d/c instructions and have appt scheduled for f/u.    Disposition: 01-Home or Self Care     Medication List    STOP taking these medications        methylPREDNISolone 4 MG Tbpk tablet  Commonly known as:  MEDROL DOSEPAK      TAKE these medications        calcium carbonate 1250 (500 Ca) MG tablet  Commonly known as:  OS-CAL - dosed in mg of elemental calcium   Take 1 tablet by mouth daily with breakfast.     diazepam 5 MG tablet  Commonly known as:  VALIUM  Take 1 tablet (5 mg total) by mouth every 6 (six) hours as needed for muscle spasms.     HYDROcodone-acetaminophen 5-325 MG tablet  Commonly known as:  NORCO/VICODIN  Take 1 tablet by mouth every 6 (six) hours as needed for moderate pain.     multivitamin with minerals Tabs tablet  Take 1 tablet by mouth daily.     oxyCODONE-acetaminophen 5-325 MG tablet  Commonly known as:  PERCOCET/ROXICET  Take 1-2 tablets by mouth every 4 (four) hours as needed for moderate pain.     oxyCODONE-acetaminophen 5-325 MG tablet  Commonly known as:  PERCOCET/ROXICET  Take 1-2 tablets by mouth every 4 (four) hours as needed for severe pain.     vitamin B-12 1000 MCG tablet  Commonly known as:  CYANOCOBALAMIN  Take 1,000 mcg by mouth daily.         Signed: Verdis Prime 01/18/2016, 4:53 PM

## 2016-01-18 NOTE — Brief Op Note (Signed)
01/18/2016  1:37 PM  PATIENT:  Jacqueline Holder  55 y.o. female  PRE-OPERATIVE DIAGNOSIS:  Cervical herniated disc C 67 Left with spondylosis, disc degeneration, radiculopathy  POST-OPERATIVE DIAGNOSIS:  Cervical herniated disc C 67 Left with spondylosis, disc degeneration, radiculopathy  PROCEDURE:  Procedure(s): Left Cervical six-seven Sitting Posterior cervical microdiskectomy with Met-Rx (Left)  SURGEON:  Surgeon(s) and Role:    * Erline Levine, MD - Primary    * Kevan Ny Ditty, MD - Assisting  PHYSICIAN ASSISTANT:   ASSISTANTS: Poteat, RN   ANESTHESIA:   general  EBL:  Total I/O In: 300 [I.V.:300] Out: 25 [Blood:25]  BLOOD ADMINISTERED:none  DRAINS: none   LOCAL MEDICATIONS USED:  LIDOCAINE   SPECIMEN:  No Specimen  DISPOSITION OF SPECIMEN:  N/A  COUNTS:  YES  TOURNIQUET:  * No tourniquets in log *  DICTATION: Patient was placed on OR table and following smooth and uncomplicated induction of general endotracheal anesthesia and placement of central line and precordial monitor by Anesthesia, the patient was placed in three pin head fixation and placed in a sitting position.  C arm was used to confirm the C 6 C 7 level.  Skin was prepped with Duraprep and draped.  Sequential dilators were used with MetRx tubular retractor (6cm x 18 mm) and docked on Left C 6 C 7  lamina.  A keyhole laminotomy was performed with high speed drill and Kerrison punches.  Using microdissection, the lateral thecal sac was identified and the C 7 nerve root was decompressed with removal of overlying bone and ligamentous tissues.  The nerve root was mobilized cephalad using micro-hooks exposing herniated disc material.  Fragments of disc were removed with micro-pituitary, Kerrison and angle curettes.  At this point, it was felt that neural elements were well decompressed.  Hemostasis was assured with gelfoam and thrombin.  The operative site was bathed in Depo Medrol.  The tubular retractor was  removed and the wound was closed with interrupted vicryl stitches and dressed with Derma bond and an occlusive dressing. The patient was removed from pins, returned to a supine position and extubated in the operating room having tolerated surgery without difficulty.  PLAN OF CARE: Admit for overnight observation  PATIENT DISPOSITION:  PACU - hemodynamically stable.   Delay start of Pharmacological VTE agent (>24hrs) due to surgical blood loss or risk of bleeding: yes

## 2016-01-18 NOTE — Op Note (Signed)
01/18/2016  1:37 PM  PATIENT:  Jacqueline Holder  55 y.o. female  PRE-OPERATIVE DIAGNOSIS:  Cervical herniated disc C 67 Left with spondylosis, disc degeneration, radiculopathy  POST-OPERATIVE DIAGNOSIS:  Cervical herniated disc C 67 Left with spondylosis, disc degeneration, radiculopathy  PROCEDURE:  Procedure(s): Left Cervical six-seven Sitting Posterior cervical microdiskectomy with Met-Rx (Left)  SURGEON:  Surgeon(s) and Role:    * Erline Levine, MD - Primary    * Kevan Ny Ditty, MD - Assisting  PHYSICIAN ASSISTANT:   ASSISTANTS: Poteat, RN   ANESTHESIA:   general  EBL:  Total I/O In: 300 [I.V.:300] Out: 25 [Blood:25]  BLOOD ADMINISTERED:none  DRAINS: none   LOCAL MEDICATIONS USED:  LIDOCAINE   SPECIMEN:  No Specimen  DISPOSITION OF SPECIMEN:  N/A  COUNTS:  YES  TOURNIQUET:  * No tourniquets in log *  DICTATION: Patient was placed on OR table and following smooth and uncomplicated induction of general endotracheal anesthesia and placement of central line and precordial monitor by Anesthesia, the patient was placed in three pin head fixation and placed in a sitting position.  C arm was used to confirm the C 6 C 7 level.  Skin was prepped with Duraprep and draped.  Sequential dilators were used with MetRx tubular retractor (6cm x 18 mm) and docked on Left C 6 C 7  lamina.  A keyhole laminotomy was performed with high speed drill and Kerrison punches.  Using microdissection, the lateral thecal sac was identified and the C 7 nerve root was decompressed with removal of overlying bone and ligamentous tissues.  The nerve root was mobilized cephalad using micro-hooks exposing herniated disc material.  Fragments of disc were removed with micro-pituitary, Kerrison and angle curettes.  At this point, it was felt that neural elements were well decompressed.  Hemostasis was assured with gelfoam and thrombin.  The operative site was bathed in Depo Medrol.  The tubular retractor was  removed and the wound was closed with interrupted vicryl stitches and dressed with Derma bond and an occlusive dressing. The patient was removed from pins, returned to a supine position and extubated in the operating room having tolerated surgery without difficulty.  PLAN OF CARE: Admit for overnight observation  PATIENT DISPOSITION:  PACU - hemodynamically stable.   Delay start of Pharmacological VTE agent (>24hrs) due to surgical blood loss or risk of bleeding: yes

## 2016-01-18 NOTE — H&P (Signed)
Patient ID:   [id number] Patient: Jacqueline Holder  Date of Birth: December 13, 1960 Visit Type: Office Visit   Date: 01/09/2016 08:30 AM Provider: Marchia Meiers. Vertell Limber MD   This 55 year old female presents for back pain.  History of Present Illness: 1.  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  She recalls no specific injury however attributes her discomfort to holding retractors and long positions during surgery.  Initially seen by orthopedic, she was prescribed Dosepak which offered no relief and a muscle relaxer was also offered no relief.  Acupuncture offered no relief  OTC NSAIDs offered no relief Dosepak offered no relief Muscle relaxer offered no relief  History: Healthy Surgical history: C-sections 1991 in 2000, breast implants 2007  MRI and x-ray on [facility]. MRI indicates left sided C6-7 disc herniation. The disc protudes and appears to be compressing on the nerve.   On today's visit complains of 8/10 pain. This pain radiates from the back of her shoulder down her left arm and tingling in all of her fingers. She first notcied this pain in late Janurary.   She is cradling her left arm due to the pain and has trouble sleeping because of it. No symptoms on the right.        PAST MEDICAL/SURGICAL HISTORY   (Detailed)  Disease/disorder Onset Date Management Date Comments    C-section  ER 01/09/2016 -1991 and 2000    Breast augmentation 2007      PAST MEDICAL HISTORY, SURGICAL HISTORY, FAMILY HISTORY, SOCIAL HISTORY AND REVIEW OF SYSTEMS I have reviewed the patient's past medical, surgical, family and social history as well as the comprehensive review of systems as included on the [facility] history form dated 01/09/2016, which I have signed.  Family History  (Detailed) Relationship Family Member Name Deceased  Age at Death Condition Onset Age Cause of Death  Father    Diabetes mellitus  N    SOCIAL HISTORY  (Detailed) Tobacco use reviewed. Preferred language is Unknown.   Smoking status: Never smoker.  SMOKING STATUS Use Status Type Smoking Status Usage Per Day Years Used Total Pack Years  no/never  Never smoker             MEDICATIONS(added, continued or stopped this visit): Started Medication Directions Instruction Stopped   Aleve 220 mg tablet Take as needed     calcium Take once daily    01/09/2016 hydrocodone 5 mg-acetaminophen 325 mg tablet take 1 tablet by oral route  every 6 hours as needed for pain     multivitamin Take once daily       ALLERGIES: Ingredient Reaction Medication Name Comment  NO KNOWN ALLERGIES     No known allergies.   REVIEW OF SYSTEMS System Neg/Pos Details  Constitutional Negative Chills, fatigue, fever, malaise, night sweats, weight gain and weight loss.  ENMT Negative Ear drainage, hearing loss, nasal drainage, otalgia, sinus pressure and sore throat.  Eyes Negative Eye discharge, eye pain and vision changes.  Respiratory Negative Chronic cough, cough, dyspnea, known TB exposure and wheezing.  Cardio Negative Chest pain, claudication, edema and irregular heartbeat/palpitations.  GI Negative Abdominal pain, blood in stool, change in stool pattern, constipation, decreased appetite, diarrhea, heartburn, nausea and vomiting.  GU Negative Dysuria, hematuria, hot flashes, irregular menses, polyuria, urinary frequency, urinary incontinence and urinary retention.  Endocrine Negative  Cold intolerance, heat intolerance, polydipsia and polyphagia.  Neuro Positive Extremity weakness, Numbness in extremity.  Psych Negative Anxiety, depression and insomnia.  Integumentary Negative Brittle hair, brittle nails, change in shape/size of mole(s), hair loss, hirsutism, hives, pruritus, rash and skin lesion.  MS Positive Neck pain.  Hema/Lymph Negative Easy  bleeding, easy bruising and lymphadenopathy.  Allergic/Immuno Negative Contact allergy, environmental allergies, food allergies and seasonal allergies.  Reproductive Negative Breast discharge, breast lump(s), dysmenorrhea, dyspareunia, history of abnormal PAP smear and vaginal discharge.     Vitals Date Temp F BP Pulse Ht In Wt Lb BMI BSA Pain Score  01/09/2016  113/75 70 64 119 20.43  6/10     PHYSICAL EXAM General Level of Distress: no acute distress Overall Appearance: normal  Head and Face  Right Left  Fundoscopic Exam:  normal normal    Cardiovascular Cardiac: regular rate and rhythm without murmur  Right Left  Carotid Pulses: normal normal  Respiratory Lungs: clear to auscultation  Neurological Orientation: normal Recent and Remote Memory: normal Attention Span and Concentration:   normal Language: normal Fund of Knowledge: normal  Right Left Sensation: normal normal Upper Extremity Coordination: normal normal  Lower Extremity Coordination: normal normal  Musculoskeletal Gait and Station: normal  Right Left Upper Extremity Muscle Strength: normal decreased Lower Extremity Muscle Strength: normal normal Upper Extremity Muscle Tone:  normal normal Lower Extremity Muscle Tone: normal normal  Motor Strength Upper and lower extremity motor strength was tested in the clinically pertinent muscles. Any abnormal findings will be noted below.   Right Left Triceps:  4-/5 Wrist Flexor:  4-/5   Deep Tendon Reflexes  Right Left Biceps: normal normal Triceps: normal normal Brachiloradialis: normal normal Patellar: normal normal Achilles: normal normal  Cranial Nerves II. Optic Nerve/Visual Fields: normal III. Oculomotor: normal IV. Trochlear: normal V. Trigeminal: normal VI. Abducens: normal VII. Facial: normal VIII. Acoustic/Vestibular: normal IX. Glossopharyngeal: normal X. Vagus: normal XI. Spinal Accessory: normal XII.  Hypoglossal: normal  Motor and other Tests Lhermittes: negative Rhomberg: negative Pronator drift: absent     Right Left Spurlings negative positive Hoffman's: normal normal Clonus: normal normal Babinski: normal normal   Additional Findings:  Parascapular discomfort on left. Reflex good bilaterally. No numbness.     IMPRESSION The patient has a C6-C7 disc protusion that causing significant pain that starts in her left parascapular region and radiates to her left hand and fingers. The patient would be a good candidate for surgery to remove the fragment of disc that is causing pressure on the nerve.  She will undergo sitting left C6 C7 posterior METRX micro-discectomy.  Completed Orders (this encounter) Order Details Reason Side Interpretation Result Initial Treatment Date Region  Cervical Spine- AP/Lat/Obls/Odontoid/Flex/Ex      01/09/2016 All Levels to All Levels   Assessment/Plan # Detail Type Description   1. Assessment Cervicalgia (M54.2).       2. Assessment Herniated nucleus pulposus, C6-7 left (M50.223).       3. Assessment Spondylosis of cervical region without myelopathy or radiculopathy (M47.812).       4. Assessment Cervical radiculopathy (M54.12).         Pain Assessment/Treatment Pain Scale: 6/10. Method: Numeric Pain Intensity Scale. Location: back. Onset: 12/26/2015. Duration: varies. Quality: discomforting. Pain Assessment/Treatment follow-up plan of care: Patient is taking medications as prescribed..  We discussed the risks and benefits of surgery. We discussed the procedure itself. The patient likes to exercise and we discussed reducing the intensity of exercise post-surgery in order  to properly recover. The patient would like to proceed with surgery at [facility].   I will prescribe her hydrocodone.   Orders: Diagnostic Procedures: Assessment Procedure  M54.12 Cervical Spine- AP/Lat/Obls/Odontoid/Flex/Ex    MEDICATIONS PRESCRIBED TODAY    Rx  Quantity Refills  HYDROCODONE-ACETAMINOPHEN 5 mg-325 mg  60 0            Provider:  Marchia Meiers. Vertell Limber MD  01/09/2016 10:06 AM Dictation edited by: Derek Mound    CC Providers: Erline Levine MD [address] [place], [place] [address]              Electronically signed by Marchia Meiers. Vertell Limber MD on 01/09/2016 10:07 AM

## 2016-01-18 NOTE — Progress Notes (Signed)
Patient ID: Jacqueline Holder, female   DOB: [birth year], 55 y.o.   MRN: [id number]  Alert, conversant, family at bedside. Mild left arm pain, mild shoulder soreness...both much improved from pre-op. Incision without erythema, swelling, or drainage. Dermabond beneath honeycomb. Soft collar in use only as needed for comfort. Good strength to confrontational testing BUE and hand intrinsics. Per drStern, pt may go home tonight if she feels up to it, otherwise plan on d/c to home in am. She & husband verbalize understanding of d/c instructions and have appt scheduled for f/u.   Verdis Prime RN BSN

## 2016-01-18 NOTE — Evaluation (Signed)
Physical Therapy Evaluation Patient Details Name: Jacqueline Holder Holder MRN: [id number] DOB: [birth year] Today's Date: 01/18/2016   History of Present Illness  Patient is a 55 yo female admitted 01/18/16 now s/p Lt C6-7 microdiskectomy due to pain/weakness/numbness neck and LUE.    PMH: noncontributory  Clinical Impression  Patient is functioning at supervision level for all mobility and gait.  Provided education on cervical precautions and mobility.  No additional PT needs identified.  Patient ready for d/c from PT perspective.    Follow Up Recommendations No PT follow up;Supervision for mobility/OOB    Equipment Recommendations  None recommended by PT    Recommendations for Other Services       Precautions / Restrictions Precautions Precautions: Cervical Precaution Comments: Provided patient with handout and reviewed precautions and mobility. Required Braces or Orthoses: Cervical Brace Cervical Brace: Soft collar;For comfort Restrictions Weight Bearing Restrictions: No      Mobility  Bed Mobility Overal bed mobility: Needs Assistance Bed Mobility: Rolling;Sidelying to Sit;Sit to Sidelying Rolling: Supervision Sidelying to sit: Supervision     Sit to sidelying: Supervision General bed mobility comments: Verbal cues for technique for log rolling and moving to sitting.  Supervision for safety.  At end of session, patient able to don pants with verbal cues for technique.  Transfers Overall transfer level: Needs assistance Equipment used: None Transfers: Sit to/from Stand Sit to Stand: Supervision         General transfer comment: Assist for safety only.  Ambulation/Gait Ambulation/Gait assistance: Supervision Ambulation Distance (Feet): 180 Feet Assistive device: None Gait Pattern/deviations: Step-through pattern;Decreased stride length Gait velocity: decreased Gait velocity interpretation: Below normal speed for age/gender General Gait Details: Patient with slow,  guarded gait.  Maintains cervical precautions during gait.  Stairs Stairs:  (Patient declined)          Engineer, building services Rankin (Stroke Patients Only)       Balance                                             Pertinent Vitals/Pain Pain Assessment: 0-10 Pain Score: 2  Pain Location: Neck/incision Pain Descriptors / Indicators: Sore Pain Intervention(s): Monitored during session    Home Living Family/patient expects to be discharged to:: Private residence Living Arrangements: Spouse/significant other Available Help at Discharge: Family;Available 24 hours/day Type of Home: House Home Access: Stairs to enter Entrance Stairs-Rails: None Entrance Stairs-Number of Steps: 1 Home Layout: Two level;Able to live on main level with bedroom/bathroom Home Equipment: None      Prior Function Level of Independence: Independent               Hand Dominance        Extremity/Trunk Assessment   Upper Extremity Assessment: Overall WFL for tasks assessed (Difficult to assess due to surgery)           Lower Extremity Assessment: Overall WFL for tasks assessed         Communication   Communication: No difficulties  Cognition Arousal/Alertness: Awake/alert Behavior During Therapy: WFL for tasks assessed/performed Overall Cognitive Status: Within Functional Limits for tasks assessed                      General Comments      Exercises        Assessment/Plan    PT Assessment Patent does  not need any further PT services  PT Diagnosis Difficulty walking;Acute pain   PT Problem List    PT Treatment Interventions     PT Goals (Current goals can be found in the Care Plan section) Acute Rehab PT Goals PT Goal Formulation: All assessment and education complete, DC therapy    Frequency     Barriers to discharge        Co-evaluation               End of Session Equipment Utilized During Treatment: Cervical  collar Activity Tolerance: Patient tolerated treatment well Patient left: in bed;with call bell/phone within reach (EOB) Nurse Communication: Mobility status (Ready for d/c from PT perspective)    Functional Assessment Tool Used: Clinical judgement Functional Limitation: Mobility: Walking and moving around Mobility: Walking and Moving Around Current Status [id number]): At least 1 percent but less than 20 percent impaired, limited or restricted Mobility: Walking and Moving Around Goal Status [phone]): At least 1 percent but less than 20 percent impaired, limited or restricted Mobility: Walking and Moving Around Discharge Status [phone]): At least 1 percent but less than 20 percent impaired, limited or restricted    Time: [id number] PT Time Calculation (min) (ACUTE ONLY): 14 min   Charges:   PT Evaluation $PT Eval Low Complexity: 1 Procedure     PT G Codes:   PT G-Codes **NOT FOR INPATIENT CLASS** Functional Assessment Tool Used: Clinical judgement Functional Limitation: Mobility: Walking and moving around Mobility: Walking and Moving Around Current Status [id number]): At least 1 percent but less than 20 percent impaired, limited or restricted Mobility: Walking and Moving Around Goal Status [phone]): At least 1 percent but less than 20 percent impaired, limited or restricted Mobility: Walking and Moving Around Discharge Status [phone]): At least 1 percent but less than 20 percent impaired, limited or restricted    Jacqueline Holder Holder 01/18/2016, 6:16 PM Jacqueline Holder Holder. Jacqueline Holder Holder, Jacqueline Holder Pager [phone]

## 2016-01-18 NOTE — Anesthesia Preprocedure Evaluation (Signed)
Anesthesia Evaluation  Patient identified by MRN, date of birth, ID band Patient awake    Reviewed: Allergy & Precautions, NPO status , Patient's Chart, lab work & pertinent test results  Airway Mallampati: II  TM Distance: >3 FB Neck ROM: Full    Dental  (+) Teeth Intact, Dental Advisory Given   Pulmonary    breath sounds clear to auscultation       Cardiovascular  Rhythm:Regular Rate:Normal     Neuro/Psych    GI/Hepatic   Endo/Other    Renal/GU      Musculoskeletal   Abdominal   Peds  Hematology   Anesthesia Other Findings   Reproductive/Obstetrics                             Anesthesia Physical Anesthesia Plan  ASA: II  Anesthesia Plan: General   Post-op Pain Management:    Induction: Intravenous  Airway Management Planned: Oral ETT  Additional Equipment: CVP  Intra-op Plan:   Post-operative Plan: Extubation in OR  Informed Consent: I have reviewed the patients History and Physical, chart, labs and discussed the procedure including the risks, benefits and alternatives for the proposed anesthesia with the patient or authorized representative who has indicated his/her understanding and acceptance.   Dental advisory given  Plan Discussed with: CRNA and Anesthesiologist  Anesthesia Plan Comments: (HNP C6-7 History of Post-op N/V  Plan general anesthesia with oral ETT and central line due to planned sitting position.  Roberts Gaudy)        Anesthesia Quick Evaluation

## 2016-01-18 NOTE — Progress Notes (Signed)
Pt arrived to pacu with right  central line in place, dced cdi per Dr Tillie Rung instructions

## 2016-01-18 NOTE — Progress Notes (Signed)
Pt and husband given D/C instructions with Rx's, verbal understanding was provided. Pt's incision is clean and dry with no sign of infection. Pt's IV was removed prior to D/C. Staples removed prior to D/C per MD order. Pt is stable @ D/C and has no other needs at this time. Holli Humbles, RN

## 2016-01-18 NOTE — Interval H&P Note (Signed)
History and Physical Interval Note:  01/18/2016 6:31 AM  Jacqueline Holder  has presented today for surgery, with the diagnosis of Cervical herniated disc  The various methods of treatment have been discussed with the patient and family. After consideration of risks, benefits and other options for treatment, the patient has consented to  Procedure(s) with comments: Left C6-7 Sitting Posterior cervical microdiskectomy with Met-Rx (Left) - Left C6-7 Sitting Posterior cervical microdiskectomy with Met-Rx as a surgical intervention .  The patient's history has been reviewed, patient examined, no change in status, stable for surgery.  I have reviewed the patient's chart and labs.  Questions were answered to the patient's satisfaction.     [name]

## 2016-01-21 ENCOUNTER — Encounter (HOSPITAL_COMMUNITY): Payer: Self-pay | Admitting: Neurosurgery

## 2016-01-29 MED FILL — HYDROCODON-APAP 5-325: 5-325 | 15 days supply | Qty: 60 | Fill #0

## 2016-02-01 MED FILL — BIMATOPROST 0.03% EYELASH S: 0.03 | 30 days supply | Qty: 5 | Fill #1

## 2016-02-07 MED FILL — tiZANidine HCL 2 MG TABS: 2 | 20 days supply | Qty: 60 | Fill #0

## 2016-04-09 MED FILL — TERBINAFINE HCL 250 MG TAB: 250 | 30 days supply | Qty: 30 | Fill #0

## 2016-04-16 MED FILL — ALPRAZolam 0.25 MG TABS: 0.25 | 10 days supply | Qty: 30 | Fill #0

## 2017-01-21 MED FILL — TERBINAFINE HCL 250 MG TAB: 250 | 60 days supply | Qty: 60 | Fill #1

## 2017-01-29 DIAGNOSIS — H9011 Conductive hearing loss, unilateral, right ear, with unrestricted hearing on the contralateral side: Secondary | ICD-10-CM | POA: Diagnosis not present

## 2017-01-29 DIAGNOSIS — H72821 Total perforations of tympanic membrane, right ear: Secondary | ICD-10-CM | POA: Diagnosis not present

## 2017-03-12 MED FILL — predniSONE 10 MG TABS: 10 | 12 days supply | Qty: 48 | Fill #0

## 2017-04-02 IMAGING — RF DG CERVICAL SPINE 2 OR 3 VIEWS
1 series · 3 of 3 positions shown · non-contrast
Comparison: 01/09/2016

CLINICAL DATA: C6-7 microdiskectomy

EXAM:
DG C-ARM 61-120 MIN; CERVICAL SPINE - 2-3 VIEW

[Series 1: run · 3 of 3 slices shown]
[im 1/3]
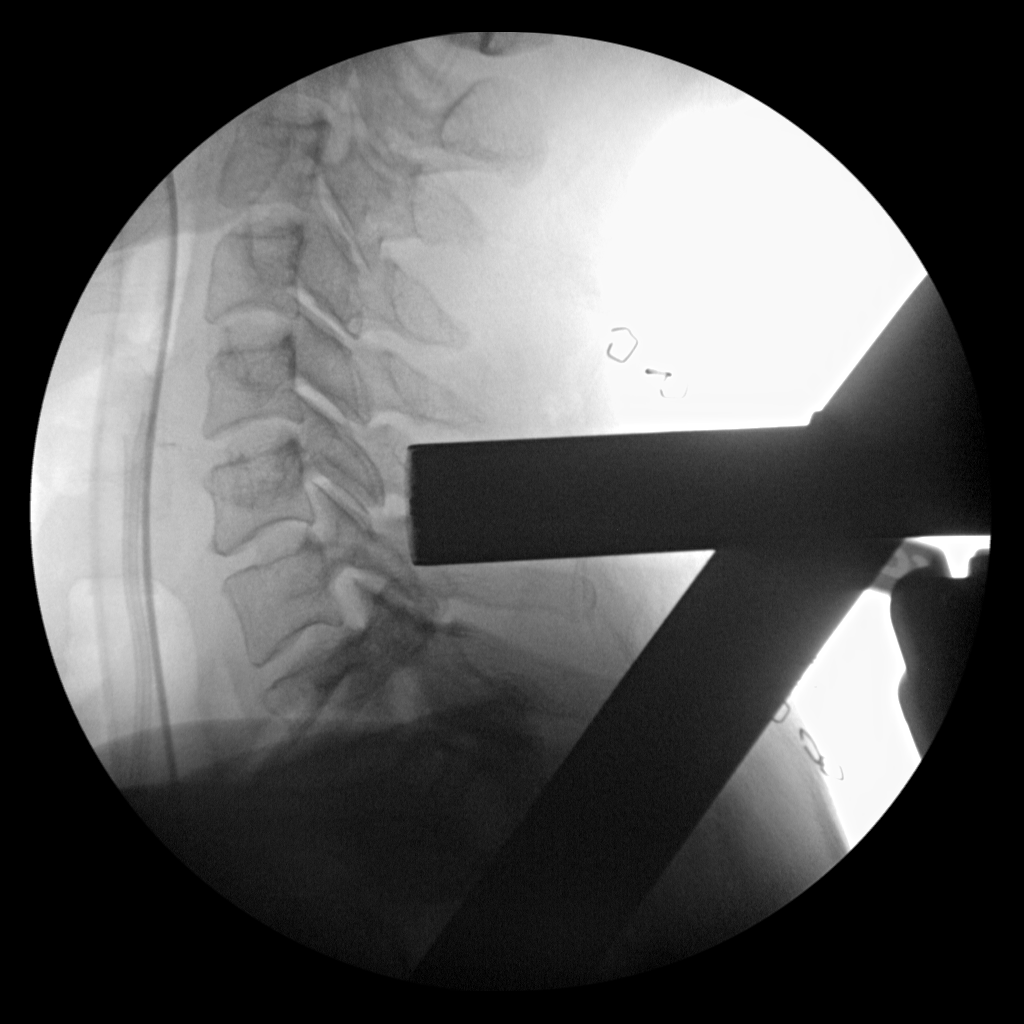
[im 2/3]
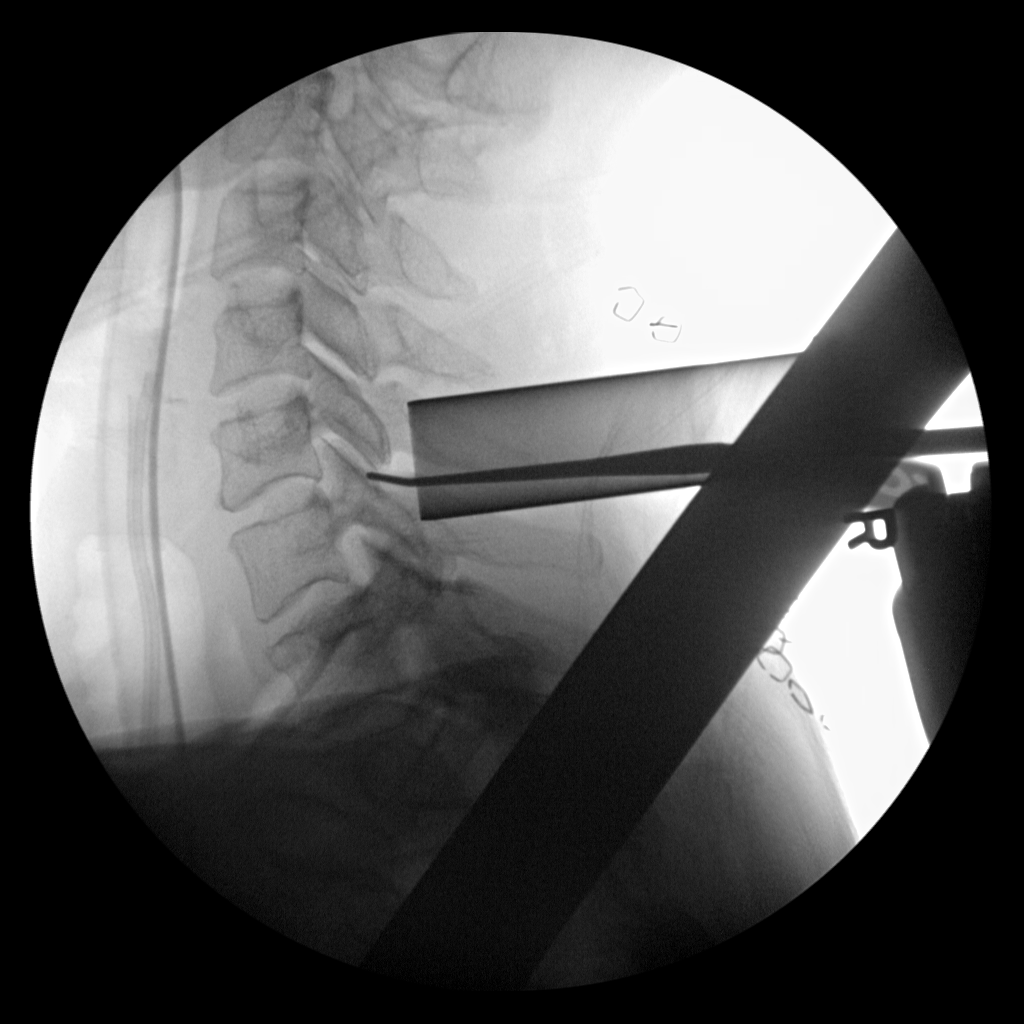
[im 3/3]
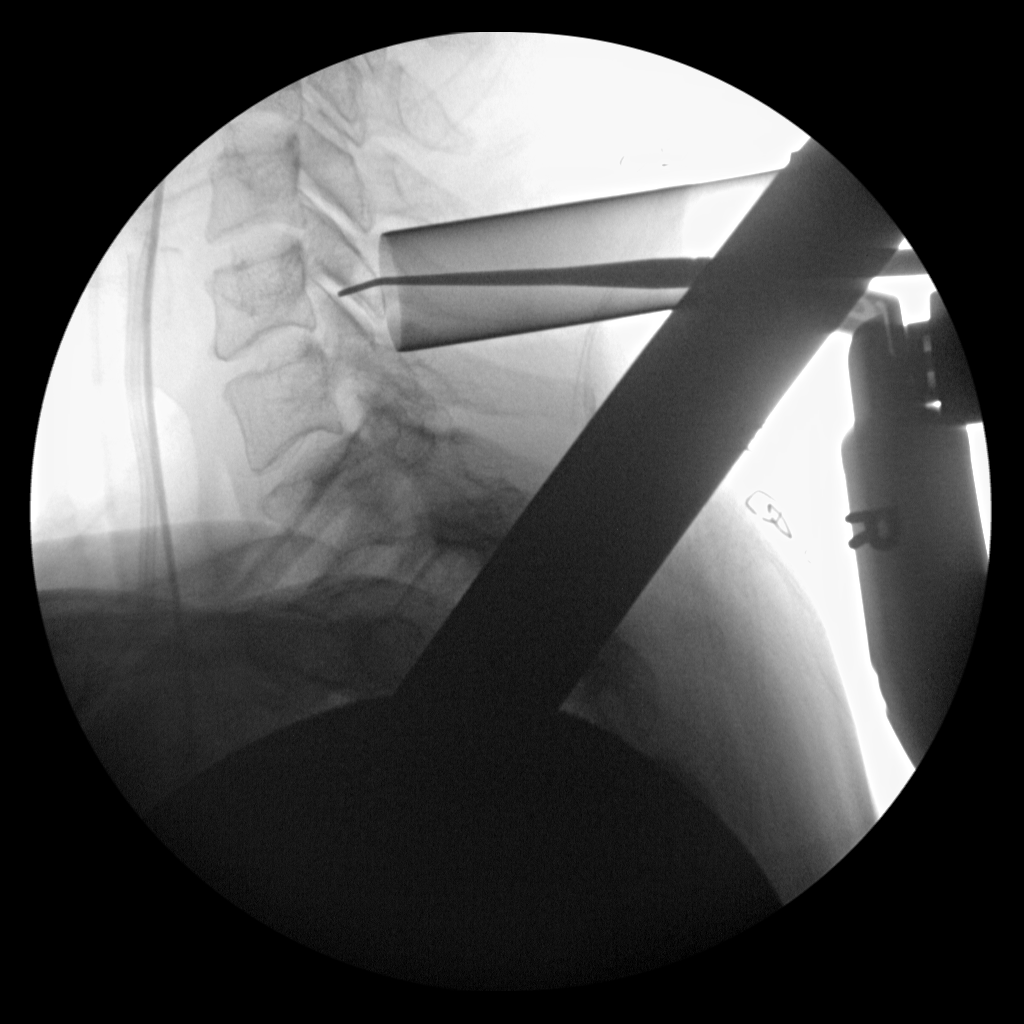

[3 of 3 positions shown; findings below may reference images not displayed]

FLUOROSCOPY TIME:  Radiation Exposure Index (as provided by the
fluoroscopic device): Not available

If the device does not provide the exposure index:

Fluoroscopy Time:  21 seconds

Number of Acquired Images:  3
FINDINGS: Three spot films were obtained intraoperatively. The initial image
demonstrates localization at the C6-7 level. Subsequent images show
surgical retractor an instruments at the C6-7 level. Correlation
with the operative findings is recommended.
IMPRESSION: Intraoperative localization at C6-7

## 2017-08-04 DIAGNOSIS — L600 Ingrowing nail: Secondary | ICD-10-CM | POA: Diagnosis not present

## 2018-05-13 DIAGNOSIS — Z01419 Encounter for gynecological examination (general) (routine) without abnormal findings: Secondary | ICD-10-CM | POA: Diagnosis not present

## 2018-05-13 DIAGNOSIS — R82998 Other abnormal findings in urine: Secondary | ICD-10-CM | POA: Diagnosis not present

## 2018-05-13 DIAGNOSIS — Z681 Body mass index (BMI) 19 or less, adult: Secondary | ICD-10-CM | POA: Diagnosis not present

## 2018-05-13 MED FILL — NITROFURANTOIN MONO-MCR 100: 100 | 7 days supply | Qty: 14 | Fill #0

## 2018-05-13 MED FILL — FLUoxetine HCL 10 MG CAPS: 10 | 30 days supply | Qty: 30 | Fill #0

## 2018-05-13 MED FILL — PROCTOFOAM-HC 1%-1% FOAM: 1-1 | 15 days supply | Qty: 10 | Fill #0

## 2018-05-18 MED FILL — ALPRAZolam 0.25 MG TABS: 0.25 | 10 days supply | Qty: 30 | Fill #0

## 2018-05-18 MED FILL — metroNIDAZOLE 500 MG TABS: 500 | 7 days supply | Qty: 14 | Fill #0

## 2018-05-20 DIAGNOSIS — Z1231 Encounter for screening mammogram for malignant neoplasm of breast: Secondary | ICD-10-CM | POA: Diagnosis not present

## 2018-05-20 DIAGNOSIS — Z13820 Encounter for screening for osteoporosis: Secondary | ICD-10-CM | POA: Diagnosis not present

## 2018-08-24 DIAGNOSIS — H9011 Conductive hearing loss, unilateral, right ear, with unrestricted hearing on the contralateral side: Secondary | ICD-10-CM | POA: Diagnosis not present

## 2018-08-24 DIAGNOSIS — H72821 Total perforations of tympanic membrane, right ear: Secondary | ICD-10-CM | POA: Diagnosis not present

## 2018-11-12 ENCOUNTER — Emergency Department (INDEPENDENT_AMBULATORY_CARE_PROVIDER_SITE_OTHER)
Admission: EM | Admit: 2018-11-12 | Discharge: 2018-11-12 | Disposition: A | Discharge status: Home / Self Care | Payer: 59 | Source: Home / Self Care | Attending: Family Medicine | Admitting: Family Medicine

## 2018-11-12 ENCOUNTER — Encounter: Payer: Self-pay | Admitting: Emergency Medicine

## 2018-11-12 DIAGNOSIS — J069 Acute upper respiratory infection, unspecified: Secondary | ICD-10-CM

## 2018-11-12 DIAGNOSIS — J019 Acute sinusitis, unspecified: Secondary | ICD-10-CM

## 2018-11-12 DIAGNOSIS — B9689 Other specified bacterial agents as the cause of diseases classified elsewhere: Secondary | ICD-10-CM

## 2018-11-12 MED ORDER — AMOXICILLIN-POT CLAVULANATE 875-125 MG PO TABS: 1.0000 | ORAL_TABLET | Freq: Two times a day (BID) | ORAL | 0 refills | Status: DC

## 2018-11-12 MED ORDER — IPRATROPIUM BROMIDE 0.06 % NA SOLN: 2.0000 | Freq: Four times a day (QID) | NASAL | 1 refills | Status: AC

## 2018-11-12 MED ORDER — BENZONATATE 100 MG PO CAPS: 100.0000 mg | ORAL_CAPSULE | Freq: Three times a day (TID) | ORAL | 0 refills | Status: DC

## 2018-11-12 MED FILL — AMOX-CLAV 875-125 MG TABLET: 875-125 | 7 days supply | Qty: 14 | Fill #0

## 2018-11-12 MED FILL — BENZONATATE 100 MG CAP: 100 | 3 days supply | Qty: 21 | Fill #0

## 2018-11-12 MED FILL — IPRATROPIUM 0.06% SPRAY: 0.06 | 10 days supply | Qty: 15 | Fill #0

## 2018-11-12 NOTE — ED Provider Notes (Addendum)
[facility]    CSN: [id number] Arrival date & time: 11/12/18  0848     History   Chief Complaint Chief Complaint  Patient presents with  . Cough    HPI Jacqueline Holder is a 57 y.o. female.   HPI  Jacqueline Holder is a 57 y.o. female presenting to UC with c/o 1 week of worsening sinus congestion with sinus pain and pressure, sore swollen lymph nodes in her neck, bilateral ear pressure, and mild intermittent cough. She has taken OTC mucinex and sudafed with minimal relief.  Subjective fever. Denies n/v.  She did have diarrhea for 2 days but wonders if it was due to the medications she has tried for her symptoms. No known sick contacts. She received the flu vaccine in October.    Past Medical History:  Diagnosis Date  . Family history of adverse reaction to anesthesia    pt not sure  . Medical history non-contributory   . PONV (postoperative nausea and vomiting)    after breasts implants    Patient Active Problem List   Diagnosis Date Noted  . Herniated cervical intervertebral disc 01/18/2016    Past Surgical History:  Procedure Laterality Date  . BREAST ENHANCEMENT SURGERY  2007  . CESAREAN SECTION     x 2  1991 and 2000  . POSTERIOR CERVICAL LAMINECTOMY WITH MET- RX Left 01/18/2016   Procedure: Left Cervical six-seven Sitting Posterior cervical microdiskectomy with Met-Rx;  Surgeon: Erline Levine, MD;  Location: [facility];  Service: Neurosurgery;  Laterality: Left;  . TUBAL LIGATION      OB History   No obstetric history on file.      Home Medications    Prior to Admission medications   Medication Sig Start Date End Date Taking? Authorizing Provider  amoxicillin-clavulanate (AUGMENTIN) 875-125 MG tablet Take 1 tablet by mouth 2 (two) times daily. One po bid x 7 days 11/12/18   Noe Gens, PA-C  benzonatate (TESSALON) 100 MG capsule Take 1-2 capsules (100-200 mg total) by mouth every 8 (eight) hours. 11/12/18   Noe Gens, PA-C  calcium  carbonate (OS-CAL - DOSED IN MG OF ELEMENTAL CALCIUM) 1250 (500 Ca) MG tablet Take 1 tablet by mouth daily with breakfast.    [provider]  diazepam (VALIUM) 5 MG tablet Take 1 tablet (5 mg total) by mouth every 6 (six) hours as needed for muscle spasms. 01/18/16   Erline Levine, MD  HYDROcodone-acetaminophen (NORCO/VICODIN) 5-325 MG tablet Take 1 tablet by mouth every 6 (six) hours as needed for moderate pain.    [provider]  ipratropium (ATROVENT) 0.06 % nasal spray Place 2 sprays into both nostrils 4 (four) times daily. 11/12/18   Noe Gens, PA-C  Multiple Vitamin (MULTIVITAMIN WITH MINERALS) TABS tablet Take 1 tablet by mouth daily.    [provider]  oxyCODONE-acetaminophen (PERCOCET/ROXICET) 5-325 MG tablet Take 1-2 tablets by mouth every 4 (four) hours as needed for severe pain.    [provider]  oxyCODONE-acetaminophen (PERCOCET/ROXICET) 5-325 MG tablet Take 1-2 tablets by mouth every 4 (four) hours as needed for moderate pain. 01/18/16   Erline Levine, MD  vitamin B-12 (CYANOCOBALAMIN) 1000 MCG tablet Take 1,000 mcg by mouth daily.    [provider]    Family History History reviewed. No pertinent family history.  Social History Social History   Tobacco Use  . Smoking status: Never Smoker  . Smokeless tobacco: Never Used  Substance Use Topics  .  Alcohol use: Yes    Alcohol/week: 14.0 standard drinks    Types: 14 Glasses of wine per week  . Drug use: No     Allergies   Chlorhexidine gluconate   Review of Systems Review of Systems  Constitutional: Positive for fever (subjective). Negative for chills.  HENT: Positive for congestion, postnasal drip, rhinorrhea, sinus pressure, sinus pain and sore throat.   Respiratory: Positive for cough. Negative for shortness of breath.   Gastrointestinal: Positive for diarrhea. Negative for nausea and vomiting.  Neurological: Positive for headaches. Negative for dizziness and  light-headedness.     Physical Exam Triage Vital Signs ED Triage Vitals  Enc Vitals Group     BP 11/12/18 0931 135/75     Pulse Rate 11/12/18 0931 66     Resp --      Temp 11/12/18 0931 97.8 F (36.6 C)     Temp Source 11/12/18 0931 Oral     SpO2 11/12/18 0931 100 %     Weight 11/12/18 0932 122 lb (55.3 kg)     Height --      Head Circumference --      Peak Flow --      Pain Score 11/12/18 0931 0     Pain Loc --      Pain Edu? --      Excl. in [place]? --    No data found.  Updated Vital Signs BP 135/75 (BP Location: Right Arm)   Pulse 66   Temp 97.8 F (36.6 C) (Oral)   Wt 122 lb (55.3 kg)   SpO2 100%   BMI 20.94 kg/m   Visual Acuity Right Eye Distance:   Left Eye Distance:   Bilateral Distance:    Right Eye Near:   Left Eye Near:    Bilateral Near:     Physical Exam Vitals signs and nursing note reviewed.  Constitutional:      Appearance: Normal appearance. She is well-developed.  HENT:     Head: Normocephalic and atraumatic.     Right Ear: Tympanic membrane normal.     Left Ear: Tympanic membrane normal.     Nose: Congestion present.     Right Sinus: Maxillary sinus tenderness and frontal sinus tenderness present.     Left Sinus: Maxillary sinus tenderness and frontal sinus tenderness present.     Mouth/Throat:     Lips: Pink.     Mouth: Mucous membranes are moist.     Pharynx: Oropharynx is clear. Uvula midline. No pharyngeal swelling, oropharyngeal exudate, posterior oropharyngeal erythema or uvula swelling.  Neck:     Musculoskeletal: Normal range of motion and neck supple.  Cardiovascular:     Rate and Rhythm: Normal rate and regular rhythm.  Pulmonary:     Effort: Pulmonary effort is normal. No respiratory distress.     Breath sounds: Normal breath sounds. No stridor. No wheezing or rhonchi.  Musculoskeletal: Normal range of motion.  Lymphadenopathy:     Cervical: Cervical adenopathy present.  Skin:    General: Skin is warm and dry.    Neurological:     Mental Status: She is alert and oriented to person, place, and time.  Psychiatric:        Behavior: Behavior normal.      UC Treatments / Results  Labs (all labs ordered are listed, but only abnormal results are displayed) Labs Reviewed - No data to display  EKG None  Radiology No results found.  Procedures Procedures (including critical care time)  Medications Ordered in UC Medications - No data to display  Initial Impression / Assessment and Plan / UC Course  I have reviewed the triage vital signs and the nursing notes.  Pertinent labs & imaging results that were available during my care of the patient were reviewed by me and considered in my medical decision making (see chart for details).     Hx and exam c/w sinusitis Will start on Augmentin Home care info provided.  Final Clinical Impressions(s) / UC Diagnoses   Final diagnoses:  Upper respiratory tract infection, unspecified type  Acute bacterial rhinosinusitis     Discharge Instructions      Please take antibiotics as prescribed and be sure to complete entire course even if you start to feel better to ensure infection does not come back.  You may take 586m acetaminophen every 4-6 hours or in combination with ibuprofen 400-6031mevery 6-8 hours as needed for pain, inflammation, and fever.  Be sure to well hydrated with clear liquids and get at least 8 hours of sleep at night, preferably more while sick.   Please follow up with family medicine in 1 week if needed.     ED Prescriptions    Medication Sig Dispense Auth. Provider   amoxicillin-clavulanate (AUGMENTIN) 875-125 MG tablet Take 1 tablet by mouth 2 (two) times daily. One po bid x 7 days 14 tablet Ronney [employer] O, PA-C   ipratropium (ATROVENT) 0.06 % nasal spray Place 2 sprays into both nostrils 4 (four) times daily. 15 mL Jiali Linney O, PA-C   benzonatate (TESSALON) 100 MG capsule Take 1-2 capsules (100-200 mg total) by  mouth every 8 (eight) hours. 21 capsule PhNoe GensPA-C     Controlled Substance Prescriptions [place] Controlled Substance Registry consulted? Not Applicable     PhTyrell Antonio2/20/19 [id number]

## 2018-11-12 NOTE — Discharge Instructions (Signed)

## 2018-11-12 NOTE — ED Triage Notes (Signed)
Pt c/o cough with mucous, congestion and sinus pressure x1 week. Afebrile. Taking mucinex and sudafed.

## 2019-04-05 MED FILL — ALPRAZolam 0.25 MG TABS: 0.25 | 10 days supply | Qty: 30 | Fill #0

## 2019-04-05 MED FILL — FLUoxetine HCL 10 MG CAPS: 10 | 30 days supply | Qty: 30 | Fill #1

## 2019-05-10 MED FILL — NITROFURANTOIN MONO-MCR 100: 100 | 7 days supply | Qty: 14 | Fill #0

## 2019-06-23 DIAGNOSIS — Z1231 Encounter for screening mammogram for malignant neoplasm of breast: Secondary | ICD-10-CM | POA: Diagnosis not present

## 2019-06-23 DIAGNOSIS — Z1321 Encounter for screening for nutritional disorder: Secondary | ICD-10-CM | POA: Diagnosis not present

## 2019-06-23 DIAGNOSIS — Z6820 Body mass index (BMI) 20.0-20.9, adult: Secondary | ICD-10-CM | POA: Diagnosis not present

## 2019-06-23 DIAGNOSIS — Z13228 Encounter for screening for other metabolic disorders: Secondary | ICD-10-CM | POA: Diagnosis not present

## 2019-06-23 DIAGNOSIS — Z1329 Encounter for screening for other suspected endocrine disorder: Secondary | ICD-10-CM | POA: Diagnosis not present

## 2019-06-23 DIAGNOSIS — Z13220 Encounter for screening for lipoid disorders: Secondary | ICD-10-CM | POA: Diagnosis not present

## 2019-06-23 DIAGNOSIS — Z01419 Encounter for gynecological examination (general) (routine) without abnormal findings: Secondary | ICD-10-CM | POA: Diagnosis not present

## 2019-06-27 MED FILL — ROSUVASTATIN CALCIUM 5 MG T: 5 | 30 days supply | Qty: 30 | Fill #0

## 2019-08-09 DIAGNOSIS — Z6820 Body mass index (BMI) 20.0-20.9, adult: Secondary | ICD-10-CM | POA: Diagnosis not present

## 2019-08-09 DIAGNOSIS — M2550 Pain in unspecified joint: Secondary | ICD-10-CM | POA: Diagnosis not present

## 2019-09-21 MED FILL — FLUoxetine HCL 10 MG CAPS: 10 | 30 days supply | Qty: 30 | Fill #0

## 2019-09-22 MED FILL — ALPRAZolam 0.25 MG TABS: 0.25 | 10 days supply | Qty: 30 | Fill #0

## 2019-11-07 MED FILL — FLUoxetine HCL 10 MG CAPS: 10 | 30 days supply | Qty: 30 | Fill #1

## 2019-12-14 ENCOUNTER — Ambulatory Visit: Payer: Self-pay

## 2019-12-14 ENCOUNTER — Other Ambulatory Visit: Payer: Self-pay

## 2019-12-14 ENCOUNTER — Ambulatory Visit (INDEPENDENT_AMBULATORY_CARE_PROVIDER_SITE_OTHER): Payer: 59 | Admitting: Orthopaedic Surgery

## 2019-12-14 DIAGNOSIS — M25512 Pain in left shoulder: Secondary | ICD-10-CM | POA: Diagnosis not present

## 2019-12-14 DIAGNOSIS — M25511 Pain in right shoulder: Secondary | ICD-10-CM

## 2019-12-14 NOTE — Progress Notes (Signed)
 Office Visit Note   Patient: Jacqueline Holder           Date of Birth: 01/01/1961           MRN: [id number] Visit Date: 12/14/2019              Requested by: No referring provider defined for this encounter. PCP: Patient, No Pcp Per   Assessment & Plan: Visit Diagnoses:  1. Bilateral shoulder pain, unspecified chronicity     Plan: Impression is a left type II AC separation.  Clinically speaking she is actually doing quite well and not had much pain.  We did talk about limiting her lifting with that arm to about 5 pounds for the next month and then she can progress as tolerated.  I do not think that she needs any physical therapy as she is moving that arm pretty well.  Questions encouraged and answered.  Follow-up as needed.  Follow-Up Instructions: Return if symptoms worsen or fail to improve.   Orders:  Orders Placed This Encounter  Procedures  . XR Shoulder Left  . XR Shoulder Right   No orders of the defined types were placed in this encounter.     Procedures: No procedures performed   Clinical Data: No additional findings.   Subjective: Chief Complaint  Patient presents with  . Right Shoulder - Pain  . Left Shoulder - Pain    Jacqueline Holder is a very pleasant 58-year-old female who works in the operating room on the heart team who comes in for evaluation of left shoulder pain and injury that she sustained while skiing around New Year's.  She fell directly onto her right shoulder and immediately felt pain.  Since then she is had pain localized to the superior aspect of the shoulder that will sometimes radiate up into the neck.  She will get sudden twinges of pain with certain arm movements.  Denies any numbness and tingling.   Review of Systems   Objective: Vital Signs: There were no vitals taken for this visit.  Physical Exam  Ortho Exam Left shoulder exam shows a slight prominence of the distal clavicle.  There is no swelling or bruising.  There is slight tenderness  to palpation.  Range of motion of the shoulder is actually quite well-preserved. Specialty Comments:  No specialty comments available.  Imaging: XR Shoulder Left  Result Date: 12/14/2019 Type 2 AC separation  XR Shoulder Right  Result Date: 12/14/2019 No acute or structural abnormalities    PMFS History: Patient Active Problem List   Diagnosis Date Noted  . Herniated cervical intervertebral disc 01/18/2016   Past Medical History:  Diagnosis Date  . Family history of adverse reaction to anesthesia    pt not sure  . Medical history non-contributory   . PONV (postoperative nausea and vomiting)    after breasts implants    No family history on file.  Past Surgical History:  Procedure Laterality Date  . BREAST ENHANCEMENT SURGERY  2007  . CESAREAN SECTION     x 2  1991 and 2000  . POSTERIOR CERVICAL LAMINECTOMY WITH MET- RX Left 01/18/2016   Procedure: Left Cervical six-seven Sitting Posterior cervical microdiskectomy with Met-Rx;  Surgeon: Joseph Stern, MD;  Location: [facility];  Service: Neurosurgery;  Laterality: Left;  . TUBAL LIGATION     Social History   Occupational History  . Not on file  Tobacco Use  . Smoking status: Never Smoker  . Smokeless tobacco: Never Used    Substance and Sexual Activity  . Alcohol use: Yes    Alcohol/week: 14.0 standard drinks    Types: 14 Glasses of wine per week  . Drug use: No  . Sexual activity: Not on file

## 2019-12-18 DIAGNOSIS — F1020 Alcohol dependence, uncomplicated: Secondary | ICD-10-CM | POA: Diagnosis not present

## 2019-12-22 MED FILL — VITAMIN B-1 100 MG TABLET: 100 | 100 days supply | Qty: 100 | Fill #0

## 2019-12-25 DIAGNOSIS — F1020 Alcohol dependence, uncomplicated: Secondary | ICD-10-CM | POA: Diagnosis not present

## 2019-12-26 DIAGNOSIS — F1020 Alcohol dependence, uncomplicated: Secondary | ICD-10-CM | POA: Diagnosis not present

## 2019-12-28 MED FILL — NALTREXONE 50 MG TABLET: 50 | 30 days supply | Qty: 30 | Fill #0

## 2019-12-29 MED FILL — FLUoxetine HCL 10 MG CAPS: 10 | 30 days supply | Qty: 30 | Fill #2

## 2020-01-01 DIAGNOSIS — F1020 Alcohol dependence, uncomplicated: Secondary | ICD-10-CM | POA: Diagnosis not present

## 2020-01-08 DIAGNOSIS — F1020 Alcohol dependence, uncomplicated: Secondary | ICD-10-CM | POA: Diagnosis not present

## 2020-01-15 DIAGNOSIS — F1020 Alcohol dependence, uncomplicated: Secondary | ICD-10-CM | POA: Diagnosis not present

## 2020-01-22 DIAGNOSIS — F1020 Alcohol dependence, uncomplicated: Secondary | ICD-10-CM | POA: Diagnosis not present

## 2020-01-25 MED FILL — VITAMIN B-1 100 MG TABLET: 100 | 100 days supply | Qty: 100 | Fill #0

## 2020-01-25 MED FILL — NALTREXONE HCL 50 MG TABS: 50 | 30 days supply | Qty: 30 | Fill #0

## 2020-01-29 DIAGNOSIS — F1020 Alcohol dependence, uncomplicated: Secondary | ICD-10-CM | POA: Diagnosis not present

## 2020-02-03 MED FILL — FLUoxetine HCL 10 MG CAPS: 10 | 30 days supply | Qty: 30 | Fill #0

## 2020-02-05 DIAGNOSIS — F1020 Alcohol dependence, uncomplicated: Secondary | ICD-10-CM | POA: Diagnosis not present

## 2020-02-07 MED FILL — NALTREXONE 50 MG TABLET: 50 | 30 days supply | Qty: 60 | Fill #0

## 2020-02-07 MED FILL — GABAPENTIN 300 MG CAPSULE: 300 | 30 days supply | Qty: 90 | Fill #0

## 2020-02-12 DIAGNOSIS — F1020 Alcohol dependence, uncomplicated: Secondary | ICD-10-CM | POA: Diagnosis not present

## 2020-02-19 DIAGNOSIS — F1020 Alcohol dependence, uncomplicated: Secondary | ICD-10-CM | POA: Diagnosis not present

## 2020-02-26 DIAGNOSIS — F1020 Alcohol dependence, uncomplicated: Secondary | ICD-10-CM | POA: Diagnosis not present

## 2020-03-04 DIAGNOSIS — F1020 Alcohol dependence, uncomplicated: Secondary | ICD-10-CM | POA: Diagnosis not present

## 2020-03-11 DIAGNOSIS — F1020 Alcohol dependence, uncomplicated: Secondary | ICD-10-CM | POA: Diagnosis not present

## 2020-03-18 DIAGNOSIS — F1020 Alcohol dependence, uncomplicated: Secondary | ICD-10-CM | POA: Diagnosis not present

## 2020-03-20 MED FILL — NALTREXONE 50 MG TABLET: 50 | 30 days supply | Qty: 60 | Fill #0

## 2020-03-25 DIAGNOSIS — F1020 Alcohol dependence, uncomplicated: Secondary | ICD-10-CM | POA: Diagnosis not present

## 2020-04-01 DIAGNOSIS — F1020 Alcohol dependence, uncomplicated: Secondary | ICD-10-CM | POA: Diagnosis not present

## 2020-04-20 MED FILL — FLUoxetine HCL 10 MG CAPS: 10 | 30 days supply | Qty: 30 | Fill #1

## 2020-07-10 DIAGNOSIS — Z01419 Encounter for gynecological examination (general) (routine) without abnormal findings: Secondary | ICD-10-CM | POA: Diagnosis not present

## 2020-07-10 DIAGNOSIS — Z1231 Encounter for screening mammogram for malignant neoplasm of breast: Secondary | ICD-10-CM | POA: Diagnosis not present

## 2020-07-10 DIAGNOSIS — Z6820 Body mass index (BMI) 20.0-20.9, adult: Secondary | ICD-10-CM | POA: Diagnosis not present

## 2020-07-16 MED FILL — FLUoxetine HCL 10 MG CAPS: 10 | 30 days supply | Qty: 30 | Fill #2

## 2020-07-16 MED FILL — ALPRAZolam 0.25 MG TABS: 0.25 | 10 days supply | Qty: 30 | Fill #0

## 2020-08-28 MED FILL — FLUoxetine HCL 10 MG CAPS: 10 | 30 days supply | Qty: 30 | Fill #3

## 2020-10-25 ENCOUNTER — Other Ambulatory Visit (HOSPITAL_BASED_OUTPATIENT_CLINIC_OR_DEPARTMENT_OTHER): Payer: Self-pay | Admitting: Radiology

## 2020-10-25 MED FILL — ALPRAZolam 0.25 MG TABS: 0.25 | 10 days supply | Qty: 30 | Fill #0

## 2020-10-25 MED FILL — FLUoxetine HCL 10 MG CAPS: 10 | 30 days supply | Qty: 30 | Fill #4

## 2021-01-08 ENCOUNTER — Other Ambulatory Visit (HOSPITAL_BASED_OUTPATIENT_CLINIC_OR_DEPARTMENT_OTHER): Payer: Self-pay | Admitting: Radiology

## 2021-01-08 MED FILL — NITROFURANTOIN MONO-MCR 100: 100 | 7 days supply | Qty: 14 | Fill #0

## 2021-03-05 ENCOUNTER — Other Ambulatory Visit (HOSPITAL_BASED_OUTPATIENT_CLINIC_OR_DEPARTMENT_OTHER): Payer: Self-pay

## 2021-03-05 MED ORDER — FLUOXETINE HCL 10 MG PO CAPS
ORAL_CAPSULE | ORAL | 4 refills | Status: DC
  Filled 2021-03-05: qty 30, 30d supply, fill #0
  Filled 2021-06-04: qty 30, 30d supply, fill #1
  Filled 2021-07-11: qty 30, 30d supply, fill #2
  Filled 2021-08-20: qty 30, 30d supply, fill #3
  Filled 2021-10-25: qty 30, 30d supply, fill #4

## 2021-04-16 ENCOUNTER — Other Ambulatory Visit (HOSPITAL_BASED_OUTPATIENT_CLINIC_OR_DEPARTMENT_OTHER): Payer: Self-pay

## 2021-04-17 ENCOUNTER — Other Ambulatory Visit (HOSPITAL_BASED_OUTPATIENT_CLINIC_OR_DEPARTMENT_OTHER): Payer: Self-pay

## 2021-04-17 DIAGNOSIS — F411 Generalized anxiety disorder: Secondary | ICD-10-CM | POA: Diagnosis not present

## 2021-04-17 DIAGNOSIS — F331 Major depressive disorder, recurrent, moderate: Secondary | ICD-10-CM | POA: Diagnosis not present

## 2021-04-17 MED ORDER — BIMATOPROST 0.03 % OP SOLN
OPHTHALMIC | 11 refills | Status: AC
  Filled 2021-04-17: qty 5, 60d supply, fill #0
  Filled 2022-01-01: qty 5, 60d supply, fill #1

## 2021-04-18 ENCOUNTER — Other Ambulatory Visit (HOSPITAL_BASED_OUTPATIENT_CLINIC_OR_DEPARTMENT_OTHER): Payer: Self-pay

## 2021-04-19 ENCOUNTER — Other Ambulatory Visit (HOSPITAL_BASED_OUTPATIENT_CLINIC_OR_DEPARTMENT_OTHER): Payer: Self-pay

## 2021-04-23 DIAGNOSIS — R102 Pelvic and perineal pain: Secondary | ICD-10-CM | POA: Diagnosis not present

## 2021-04-23 DIAGNOSIS — N83201 Unspecified ovarian cyst, right side: Secondary | ICD-10-CM | POA: Diagnosis not present

## 2021-04-29 DIAGNOSIS — F411 Generalized anxiety disorder: Secondary | ICD-10-CM | POA: Diagnosis not present

## 2021-04-29 DIAGNOSIS — F331 Major depressive disorder, recurrent, moderate: Secondary | ICD-10-CM | POA: Diagnosis not present

## 2021-05-01 DIAGNOSIS — N83209 Unspecified ovarian cyst, unspecified side: Secondary | ICD-10-CM | POA: Diagnosis not present

## 2021-05-01 DIAGNOSIS — R1909 Other intra-abdominal and pelvic swelling, mass and lump: Secondary | ICD-10-CM | POA: Diagnosis not present

## 2021-05-01 DIAGNOSIS — R102 Pelvic and perineal pain: Secondary | ICD-10-CM | POA: Diagnosis not present

## 2021-05-02 ENCOUNTER — Other Ambulatory Visit (HOSPITAL_BASED_OUTPATIENT_CLINIC_OR_DEPARTMENT_OTHER): Payer: Self-pay

## 2021-05-02 DIAGNOSIS — R5383 Other fatigue: Secondary | ICD-10-CM | POA: Diagnosis not present

## 2021-05-02 DIAGNOSIS — N83201 Unspecified ovarian cyst, right side: Secondary | ICD-10-CM | POA: Diagnosis not present

## 2021-05-02 MED ORDER — HYDROCODONE-ACETAMINOPHEN 5-325 MG PO TABS
ORAL_TABLET | ORAL | 0 refills | Status: DC
  Filled 2021-05-02: qty 30, 4d supply, fill #0

## 2021-05-14 DIAGNOSIS — R1031 Right lower quadrant pain: Secondary | ICD-10-CM | POA: Diagnosis not present

## 2021-05-14 DIAGNOSIS — R102 Pelvic and perineal pain: Secondary | ICD-10-CM | POA: Diagnosis not present

## 2021-05-14 DIAGNOSIS — N83291 Other ovarian cyst, right side: Secondary | ICD-10-CM | POA: Diagnosis not present

## 2021-05-15 ENCOUNTER — Other Ambulatory Visit (HOSPITAL_BASED_OUTPATIENT_CLINIC_OR_DEPARTMENT_OTHER): Payer: Self-pay

## 2021-05-15 DIAGNOSIS — R102 Pelvic and perineal pain: Secondary | ICD-10-CM | POA: Diagnosis not present

## 2021-05-15 DIAGNOSIS — D270 Benign neoplasm of right ovary: Secondary | ICD-10-CM | POA: Diagnosis not present

## 2021-05-15 DIAGNOSIS — N83332 Acquired atrophy of left ovary and fallopian tube: Secondary | ICD-10-CM | POA: Diagnosis not present

## 2021-05-15 DIAGNOSIS — N83201 Unspecified ovarian cyst, right side: Secondary | ICD-10-CM | POA: Diagnosis not present

## 2021-05-15 MED ORDER — ONDANSETRON 8 MG PO TBDP
ORAL_TABLET | ORAL | 1 refills | Status: AC
  Filled 2021-05-15: qty 20, 6d supply, fill #0
  Filled 2021-07-10: qty 20, 6d supply, fill #1

## 2021-06-04 ENCOUNTER — Other Ambulatory Visit (HOSPITAL_BASED_OUTPATIENT_CLINIC_OR_DEPARTMENT_OTHER): Payer: Self-pay

## 2021-06-05 ENCOUNTER — Other Ambulatory Visit (HOSPITAL_BASED_OUTPATIENT_CLINIC_OR_DEPARTMENT_OTHER): Payer: Self-pay

## 2021-06-05 DIAGNOSIS — F411 Generalized anxiety disorder: Secondary | ICD-10-CM | POA: Diagnosis not present

## 2021-06-05 DIAGNOSIS — F331 Major depressive disorder, recurrent, moderate: Secondary | ICD-10-CM | POA: Diagnosis not present

## 2021-06-26 DIAGNOSIS — F331 Major depressive disorder, recurrent, moderate: Secondary | ICD-10-CM | POA: Diagnosis not present

## 2021-06-26 DIAGNOSIS — F411 Generalized anxiety disorder: Secondary | ICD-10-CM | POA: Diagnosis not present

## 2021-07-10 ENCOUNTER — Other Ambulatory Visit (HOSPITAL_BASED_OUTPATIENT_CLINIC_OR_DEPARTMENT_OTHER): Payer: Self-pay

## 2021-07-11 ENCOUNTER — Other Ambulatory Visit (HOSPITAL_BASED_OUTPATIENT_CLINIC_OR_DEPARTMENT_OTHER): Payer: Self-pay

## 2021-07-22 DIAGNOSIS — F411 Generalized anxiety disorder: Secondary | ICD-10-CM | POA: Diagnosis not present

## 2021-07-22 DIAGNOSIS — F331 Major depressive disorder, recurrent, moderate: Secondary | ICD-10-CM | POA: Diagnosis not present

## 2021-08-19 DIAGNOSIS — F331 Major depressive disorder, recurrent, moderate: Secondary | ICD-10-CM | POA: Diagnosis not present

## 2021-08-19 DIAGNOSIS — F411 Generalized anxiety disorder: Secondary | ICD-10-CM | POA: Diagnosis not present

## 2021-08-20 ENCOUNTER — Other Ambulatory Visit (HOSPITAL_BASED_OUTPATIENT_CLINIC_OR_DEPARTMENT_OTHER): Payer: Self-pay

## 2021-10-02 ENCOUNTER — Other Ambulatory Visit: Payer: Self-pay

## 2021-10-02 ENCOUNTER — Emergency Department (HOSPITAL_BASED_OUTPATIENT_CLINIC_OR_DEPARTMENT_OTHER)
Admission: EM | Admit: 2021-10-02 | Discharge: 2021-10-02 | Disposition: A | Discharge status: Home / Self Care | Payer: 59 | Attending: Emergency Medicine | Admitting: Emergency Medicine

## 2021-10-02 DIAGNOSIS — W260XXA Contact with knife, initial encounter: Secondary | ICD-10-CM | POA: Diagnosis not present

## 2021-10-02 DIAGNOSIS — Z23 Encounter for immunization: Secondary | ICD-10-CM | POA: Diagnosis not present

## 2021-10-02 DIAGNOSIS — S6992XA Unspecified injury of left wrist, hand and finger(s), initial encounter: Secondary | ICD-10-CM | POA: Diagnosis present

## 2021-10-02 DIAGNOSIS — S61412A Laceration without foreign body of left hand, initial encounter: Secondary | ICD-10-CM | POA: Diagnosis not present

## 2021-10-02 MED ORDER — LIDOCAINE HCL (PF) 1 % IJ SOLN
10.0000 mL | Freq: Once | INTRAMUSCULAR | Status: AC
  Administered 2021-10-02: 10 mL
  Filled 2021-10-02: qty 10

## 2021-10-02 MED ORDER — TETANUS-DIPHTH-ACELL PERTUSSIS 5-2.5-18.5 LF-MCG/0.5 IM SUSY
0.5000 mL | PREFILLED_SYRINGE | Freq: Once | INTRAMUSCULAR | Status: AC
  Administered 2021-10-02: 0.5 mL via INTRAMUSCULAR
  Filled 2021-10-02: qty 0.5

## 2021-10-02 NOTE — ED Provider Notes (Signed)
[place] [facility] DEPART[facility] Provider Note   CSN: [id number] Arrival date & time: 10/02/21  0541     History Chief Complaint  Patient presents with   Laceration    Hand left    Jacqueline Holder is a 60 y.o. female.  The history is provided by the patient.  Laceration She cut her left hand with a knife.  She denies any numbness or difficulty moving her fingers.  Last tetanus immunization is unknown.   Past Medical History:  Diagnosis Date   Family history of adverse reaction to anesthesia    pt not sure   Medical history non-contributory    PONV (postoperative nausea and vomiting)    after breasts implants    Patient Active Problem List   Diagnosis Date Noted   Herniated cervical intervertebral disc 01/18/2016    Past Surgical History:  Procedure Laterality Date   BREAST ENHANCEMENT SURGERY  2007   CESAREAN SECTION     x 2  1991 and 2000   POSTERIOR CERVICAL LAMINECTOMY WITH MET- RX Left 01/18/2016   Procedure: Left Cervical six-seven Sitting Posterior cervical microdiskectomy with Met-Rx;  Surgeon: Erline Levine, MD;  Location: [facility];  Service: Neurosurgery;  Laterality: Left;   TUBAL LIGATION       OB History   No obstetric history on file.     No family history on file.  Social History   Tobacco Use   Smoking status: Never   Smokeless tobacco: Never  Vaping Use   Vaping Use: Never used  Substance Use Topics   Alcohol use: Yes    Alcohol/week: 14.0 standard drinks    Types: 14 Glasses of wine per week   Drug use: No    Home Medications Prior to Admission medications   Medication Sig Start Date End Date Taking? Authorizing Provider  amoxicillin-clavulanate (AUGMENTIN) 875-125 MG tablet Take 1 tablet by mouth 2 (two) times daily. One po bid x 7 days 11/12/18   Noe Gens, PA-C  benzonatate (TESSALON) 100 MG capsule Take 1-2 capsules (100-200 mg total) by mouth every 8 (eight) hours. 11/12/18   Noe Gens, PA-C  bimatoprost  (LUMIGAN) 0.03 % ophthalmic solution APPLY IN THE EVENING TO UPPER EYELID MARGIN 04/17/21     calcium carbonate (OS-CAL - DOSED IN MG OF ELEMENTAL CALCIUM) 1250 (500 Ca) MG tablet Take 1 tablet by mouth daily with breakfast.    [provider]  diazepam (VALIUM) 5 MG tablet Take 1 tablet (5 mg total) by mouth every 6 (six) hours as needed for muscle spasms. 01/18/16   Erline Levine, MD  FLUoxetine (PROZAC) 10 MG capsule TAKE 1 CAPSULE BY MOUTH  ONCE DAILY IN THE EVENING. 03/05/21     HYDROcodone-acetaminophen (NORCO/VICODIN) 5-325 MG tablet Take 1 tablet by mouth every 6 (six) hours as needed for moderate pain.    [provider]  HYDROcodone-acetaminophen (NORCO/VICODIN) 5-325 MG tablet take 1-2 by mouth every 6 hours as needed for pain 05/02/21     ipratropium (ATROVENT) 0.06 % nasal spray Place 2 sprays into both nostrils 4 (four) times daily. 11/12/18   Noe Gens, PA-C  Multiple Vitamin (MULTIVITAMIN WITH MINERALS) TABS tablet Take 1 tablet by mouth daily.    [provider]  nitrofurantoin, macrocrystal-monohydrate, (MACROBID) 100 MG capsule TAKE 1 CAPSULE BY MOUTH EVERY 12 HOURS FOR 7 DAYS 01/08/21 01/08/22  Chrzanowski, Jami B, NP  ondansetron (ZOFRAN-ODT) 8 MG disintegrating tablet Dissolve 1 tablet by mouth every 8  hours as needed for nausea 05/14/21     oxyCODONE-acetaminophen (PERCOCET/ROXICET) 5-325 MG tablet Take 1-2 tablets by mouth every 4 (four) hours as needed for severe pain.    [provider]  oxyCODONE-acetaminophen (PERCOCET/ROXICET) 5-325 MG tablet Take 1-2 tablets by mouth every 4 (four) hours as needed for moderate pain. 01/18/16   Erline Levine, MD  vitamin B-12 (CYANOCOBALAMIN) 1000 MCG tablet Take 1,000 mcg by mouth daily.    [provider]    Allergies    Chlorhexidine gluconate  Review of Systems   Review of Systems  All other systems reviewed and are negative.  Physical Exam Updated Vital Signs BP 113/81   Pulse 64    Temp 97.7 F (36.5 C) (Oral)   Resp 20   Ht _0  (1.626 m)   Wt 55.3 kg   SpO2 95%   BMI 20.94 kg/m   Physical Exam Vitals and nursing note reviewed.  60 year old female, resting comfortably and in no acute distress. Vital signs are normal. Oxygen saturation is 95%, which is normal. Head is normocephalic and atraumatic. PERRLA, EOMI.  Neck is nontender and supple. Lungs are clear without rales, wheezes, or rhonchi. Chest is nontender. Heart has regular rate and rhythm without murmur. Abdomen is soft, flat, nontender. Extremities: There is a laceration of the left palm overlying the second metacarpal.  Laceration is transverse orientation.  There is normal flexor tendon strength.  There is normal distal sensation.  There is prompt capillary refill. Skin is warm and dry without rash. Neurologic: Mental status is normal, cranial nerves are intact, moves all extremities equally.  ED Results / Procedures / Treatments    Procedures .Marland KitchenLaceration Repair  Date/Time: 10/02/2021 6:31 AM Performed by: Delora Fuel, MD Authorized by: Delora Fuel, MD   Consent:    Risks discussed:  Infection, pain, poor wound healing, tendon damage and vascular damage   Alternatives discussed:  No treatment Universal protocol:    Procedure explained and questions answered to patient or proxy's satisfaction: yes     Relevant documents present and verified: yes     Test results available: yes     Required blood products, implants, devices, and special equipment available: yes     Site/side marked: yes     Immediately prior to procedure, a time out was called: yes     Patient identity confirmed:  Verbally with patient and arm band Anesthesia:    Anesthesia method:  Local infiltration   Local anesthetic:  Lidocaine 1% w/o epi Laceration details:    Location:  Hand   Hand location:  L palm   Length (cm):  4   Depth (mm):  3 Pre-procedure details:    Preparation:  Patient was prepped and draped in  usual sterile fashion Exploration:    Limited defect created (wound extended): no     Hemostasis achieved with:  Direct pressure   Wound exploration: wound explored through full range of motion and entire depth of wound visualized     Wound extent: no foreign bodies/material noted, no nerve damage noted, no tendon damage noted and no vascular damage noted     Contaminated: no   Treatment:    Area cleansed with:  Saline   Amount of cleaning:  Standard   Debridement:  None   Undermining:  None   Scar revision: no   Skin repair:    Repair method:  Sutures   Suture size:  4-0   Suture material:  Prolene  Suture technique:  Simple interrupted   Number of sutures:  7 Approximation:    Approximation:  Close Repair type:    Repair type:  Simple Post-procedure details:    Dressing:  Antibiotic ointment and sterile dressing   Procedure completion:  Tolerated well, no immediate complications   Medications Ordered in ED Medications  Tdap (BOOSTRIX) injection 0.5 mL (0.5 mLs Intramuscular Given 10/02/21 0602)  lidocaine (PF) (XYLOCAINE) 1 % injection 10 mL (10 mLs Infiltration Given 10/02/21 0601)    ED Course  I have reviewed the triage vital signs and the nursing notes.  MDM Rules/Calculators/A&P                         Laceration of left hand.  No evidence of nerve, vascular, tendon injury.  Old records reviewed, no record of tetanus immunization found.  She is given a Tdap booster, laceration is closed with sutures.  Final Clinical Impression(s) / ED Diagnoses Final diagnoses:  Laceration of left palm, initial encounter    Rx / DC Orders ED Discharge Orders     None        Delora Fuel, MD 30/94/07 [phone]

## 2021-10-02 NOTE — ED Triage Notes (Signed)
Pt sustained approx 2 in laceration to the palm of the left hand between the index and thumb with cutting knife. Unknown las tetanus. Bleeding controlled.

## 2021-10-25 ENCOUNTER — Other Ambulatory Visit (HOSPITAL_BASED_OUTPATIENT_CLINIC_OR_DEPARTMENT_OTHER): Payer: Self-pay

## 2021-11-04 ENCOUNTER — Other Ambulatory Visit (HOSPITAL_BASED_OUTPATIENT_CLINIC_OR_DEPARTMENT_OTHER): Payer: Self-pay

## 2021-11-04 MED ORDER — AMOXICILLIN 500 MG PO CAPS
ORAL_CAPSULE | ORAL | 0 refills | Status: AC
  Filled 2021-11-04: qty 20, 10d supply, fill #0

## 2021-12-11 ENCOUNTER — Other Ambulatory Visit (HOSPITAL_BASED_OUTPATIENT_CLINIC_OR_DEPARTMENT_OTHER): Payer: Self-pay

## 2021-12-12 ENCOUNTER — Other Ambulatory Visit (HOSPITAL_BASED_OUTPATIENT_CLINIC_OR_DEPARTMENT_OTHER): Payer: Self-pay

## 2021-12-12 MED ORDER — FLUOXETINE HCL 10 MG PO CAPS
ORAL_CAPSULE | ORAL | 0 refills | Status: AC
  Filled 2021-12-12: qty 30, 30d supply, fill #0

## 2021-12-20 ENCOUNTER — Other Ambulatory Visit (HOSPITAL_BASED_OUTPATIENT_CLINIC_OR_DEPARTMENT_OTHER): Payer: Self-pay

## 2021-12-24 DIAGNOSIS — Z20828 Contact with and (suspected) exposure to other viral communicable diseases: Secondary | ICD-10-CM | POA: Diagnosis not present

## 2021-12-25 ENCOUNTER — Other Ambulatory Visit (HOSPITAL_BASED_OUTPATIENT_CLINIC_OR_DEPARTMENT_OTHER): Payer: Self-pay

## 2021-12-25 DIAGNOSIS — F331 Major depressive disorder, recurrent, moderate: Secondary | ICD-10-CM | POA: Diagnosis not present

## 2021-12-25 DIAGNOSIS — F411 Generalized anxiety disorder: Secondary | ICD-10-CM | POA: Diagnosis not present

## 2021-12-25 MED ORDER — OSELTAMIVIR PHOSPHATE 75 MG PO CAPS
75.0000 mg | ORAL_CAPSULE | Freq: Two times a day (BID) | ORAL | 0 refills | Status: AC
  Filled 2021-12-25: qty 20, 10d supply, fill #0

## 2022-01-01 ENCOUNTER — Other Ambulatory Visit (HOSPITAL_BASED_OUTPATIENT_CLINIC_OR_DEPARTMENT_OTHER): Payer: Self-pay

## 2022-01-01 MED ORDER — BIMATOPROST 0.03 % EX SOLN
CUTANEOUS | 11 refills | Status: AC
  Filled 2022-08-22: qty 3, 30d supply, fill #0

## 2022-01-02 ENCOUNTER — Other Ambulatory Visit (HOSPITAL_BASED_OUTPATIENT_CLINIC_OR_DEPARTMENT_OTHER): Payer: Self-pay

## 2022-03-10 ENCOUNTER — Other Ambulatory Visit (HOSPITAL_BASED_OUTPATIENT_CLINIC_OR_DEPARTMENT_OTHER): Payer: Self-pay

## 2022-03-10 DIAGNOSIS — N952 Postmenopausal atrophic vaginitis: Secondary | ICD-10-CM | POA: Diagnosis not present

## 2022-03-10 DIAGNOSIS — R109 Unspecified abdominal pain: Secondary | ICD-10-CM | POA: Diagnosis not present

## 2022-03-10 DIAGNOSIS — R102 Pelvic and perineal pain: Secondary | ICD-10-CM | POA: Diagnosis not present

## 2022-03-10 MED ORDER — ESTRADIOL 0.1 MG/GM VA CREA
TOPICAL_CREAM | VAGINAL | 4 refills | Status: AC
  Filled 2022-03-10: qty 42.5, 90d supply, fill #0

## 2022-04-02 ENCOUNTER — Other Ambulatory Visit (HOSPITAL_BASED_OUTPATIENT_CLINIC_OR_DEPARTMENT_OTHER): Payer: Self-pay

## 2022-04-02 MED ORDER — CIPROFLOXACIN HCL 500 MG PO TABS
500.0000 mg | ORAL_TABLET | Freq: Every day | ORAL | 0 refills | Status: AC
  Filled 2022-04-02: qty 5, 5d supply, fill #0

## 2022-08-22 ENCOUNTER — Other Ambulatory Visit (HOSPITAL_BASED_OUTPATIENT_CLINIC_OR_DEPARTMENT_OTHER): Payer: Self-pay

## 2022-08-25 ENCOUNTER — Other Ambulatory Visit (HOSPITAL_BASED_OUTPATIENT_CLINIC_OR_DEPARTMENT_OTHER): Payer: Self-pay

## 2023-03-09 ENCOUNTER — Other Ambulatory Visit (HOSPITAL_BASED_OUTPATIENT_CLINIC_OR_DEPARTMENT_OTHER): Payer: Self-pay

## 2023-03-09 MED ORDER — NITROFURANTOIN MONOHYD MACRO 100 MG PO CAPS
100.0000 mg | ORAL_CAPSULE | Freq: Two times a day (BID) | ORAL | 0 refills | Status: AC
  Filled 2023-03-09: qty 14, 7d supply, fill #0

## 2023-04-07 ENCOUNTER — Other Ambulatory Visit (HOSPITAL_BASED_OUTPATIENT_CLINIC_OR_DEPARTMENT_OTHER): Payer: Self-pay

## 2023-04-08 ENCOUNTER — Other Ambulatory Visit (HOSPITAL_BASED_OUTPATIENT_CLINIC_OR_DEPARTMENT_OTHER): Payer: Self-pay

## 2023-04-09 ENCOUNTER — Other Ambulatory Visit (HOSPITAL_BASED_OUTPATIENT_CLINIC_OR_DEPARTMENT_OTHER): Payer: Self-pay

## 2023-04-10 ENCOUNTER — Other Ambulatory Visit (HOSPITAL_BASED_OUTPATIENT_CLINIC_OR_DEPARTMENT_OTHER): Payer: Self-pay

## 2023-04-13 ENCOUNTER — Other Ambulatory Visit (HOSPITAL_BASED_OUTPATIENT_CLINIC_OR_DEPARTMENT_OTHER): Payer: Self-pay

## 2023-04-13 MED ORDER — AMOXICILLIN 500 MG PO CAPS
500.0000 mg | ORAL_CAPSULE | Freq: Two times a day (BID) | ORAL | 0 refills | Status: AC
  Filled 2023-04-13: qty 20, 10d supply, fill #0

## 2023-04-13 MED ORDER — BIMATOPROST 0.03 % EX SOLN
1.0000 [drp] | Freq: Every evening | CUTANEOUS | 11 refills | Status: AC
  Filled 2023-04-13: qty 5, 30d supply, fill #0
  Filled 2023-05-13: qty 5, 30d supply, fill #1

## 2023-04-14 ENCOUNTER — Other Ambulatory Visit (HOSPITAL_BASED_OUTPATIENT_CLINIC_OR_DEPARTMENT_OTHER): Payer: Self-pay

## 2023-04-17 ENCOUNTER — Other Ambulatory Visit (HOSPITAL_BASED_OUTPATIENT_CLINIC_OR_DEPARTMENT_OTHER): Payer: Self-pay

## 2023-04-24 ENCOUNTER — Other Ambulatory Visit (HOSPITAL_BASED_OUTPATIENT_CLINIC_OR_DEPARTMENT_OTHER): Payer: Self-pay

## 2023-05-13 ENCOUNTER — Other Ambulatory Visit (HOSPITAL_BASED_OUTPATIENT_CLINIC_OR_DEPARTMENT_OTHER): Payer: Self-pay

## 2023-05-14 ENCOUNTER — Other Ambulatory Visit (HOSPITAL_BASED_OUTPATIENT_CLINIC_OR_DEPARTMENT_OTHER): Payer: Self-pay

## 2023-05-14 MED ORDER — FLUOXETINE HCL 10 MG PO CAPS
10.0000 mg | ORAL_CAPSULE | Freq: Every evening | ORAL | 1 refills | Status: DC
  Filled 2023-05-14: qty 30, 30d supply, fill #0
  Filled 2024-02-16: qty 30, 30d supply, fill #1

## 2023-05-18 ENCOUNTER — Other Ambulatory Visit (HOSPITAL_BASED_OUTPATIENT_CLINIC_OR_DEPARTMENT_OTHER): Payer: Self-pay

## 2024-03-18 ENCOUNTER — Other Ambulatory Visit (HOSPITAL_BASED_OUTPATIENT_CLINIC_OR_DEPARTMENT_OTHER): Payer: Self-pay

## 2024-03-21 ENCOUNTER — Other Ambulatory Visit (HOSPITAL_BASED_OUTPATIENT_CLINIC_OR_DEPARTMENT_OTHER): Payer: Self-pay

## 2024-03-21 MED ORDER — FLUOXETINE HCL 10 MG PO CAPS
10.0000 mg | ORAL_CAPSULE | Freq: Every evening | ORAL | 1 refills | Status: AC
  Filled 2024-03-21: qty 30, 30d supply, fill #0
  Filled 2024-08-10: qty 30, 30d supply, fill #1

## 2024-08-17 ENCOUNTER — Other Ambulatory Visit (HOSPITAL_BASED_OUTPATIENT_CLINIC_OR_DEPARTMENT_OTHER): Payer: Self-pay

## 2024-08-19 ENCOUNTER — Other Ambulatory Visit (HOSPITAL_BASED_OUTPATIENT_CLINIC_OR_DEPARTMENT_OTHER): Payer: Self-pay

## 2024-08-24 ENCOUNTER — Other Ambulatory Visit (HOSPITAL_BASED_OUTPATIENT_CLINIC_OR_DEPARTMENT_OTHER): Payer: Self-pay

## 2024-08-24 MED ORDER — BIMATOPROST 0.03 % EX SOLN
1.0000 [drp] | Freq: Every evening | CUTANEOUS | 6 refills | Status: AC
  Filled 2024-08-24: qty 5, 30d supply, fill #0

## 2024-08-29 ENCOUNTER — Other Ambulatory Visit (HOSPITAL_BASED_OUTPATIENT_CLINIC_OR_DEPARTMENT_OTHER): Payer: Self-pay

## 2024-09-01 ENCOUNTER — Other Ambulatory Visit (HOSPITAL_BASED_OUTPATIENT_CLINIC_OR_DEPARTMENT_OTHER): Payer: Self-pay

## 2024-09-30 ENCOUNTER — Other Ambulatory Visit (HOSPITAL_BASED_OUTPATIENT_CLINIC_OR_DEPARTMENT_OTHER): Payer: Self-pay

## 2024-11-21 ENCOUNTER — Other Ambulatory Visit (HOSPITAL_BASED_OUTPATIENT_CLINIC_OR_DEPARTMENT_OTHER): Payer: Self-pay

## 2024-11-21 MED ORDER — ONDANSETRON 4 MG PO TBDP
4.0000 mg | ORAL_TABLET | Freq: Three times a day (TID) | ORAL | 0 refills | Status: AC | PRN
  Filled 2024-11-21: qty 10, 4d supply, fill #0

## 2024-11-21 MED ORDER — DICYCLOMINE HCL 10 MG PO CAPS
10.0000 mg | ORAL_CAPSULE | Freq: Three times a day (TID) | ORAL | 0 refills | Status: AC
  Filled 2024-11-21: qty 20, 5d supply, fill #0

## 2024-12-07 ENCOUNTER — Other Ambulatory Visit (HOSPITAL_BASED_OUTPATIENT_CLINIC_OR_DEPARTMENT_OTHER): Payer: Self-pay

## 2024-12-28 ENCOUNTER — Other Ambulatory Visit (HOSPITAL_BASED_OUTPATIENT_CLINIC_OR_DEPARTMENT_OTHER): Payer: Self-pay

## 2024-12-28 MED ORDER — FLUOXETINE HCL 10 MG PO CAPS
10.0000 mg | ORAL_CAPSULE | Freq: Every day | ORAL | 3 refills | Status: AC
  Filled 2024-12-28: qty 90, 90d supply, fill #0
